# Patient Record
Sex: Male | Born: 1960 | Race: White | Hispanic: No | Smoking: Never smoker
Health system: Southern US, Community
[De-identification: ages and names within clinical notes are randomized; demographics above are authoritative.]

## PROBLEM LIST (undated history)

## (undated) DIAGNOSIS — D649 Anemia, unspecified: Secondary | ICD-10-CM

## (undated) DIAGNOSIS — M1990 Unspecified osteoarthritis, unspecified site: Secondary | ICD-10-CM

## (undated) DIAGNOSIS — F32A Depression, unspecified: Secondary | ICD-10-CM

## (undated) DIAGNOSIS — K2270 Barrett's esophagus without dysplasia: Secondary | ICD-10-CM

## (undated) DIAGNOSIS — F329 Major depressive disorder, single episode, unspecified: Secondary | ICD-10-CM

## (undated) DIAGNOSIS — G2581 Restless legs syndrome: Secondary | ICD-10-CM

## (undated) DIAGNOSIS — T7840XA Allergy, unspecified, initial encounter: Secondary | ICD-10-CM

## (undated) DIAGNOSIS — F909 Attention-deficit hyperactivity disorder, unspecified type: Secondary | ICD-10-CM

## (undated) DIAGNOSIS — K219 Gastro-esophageal reflux disease without esophagitis: Secondary | ICD-10-CM

## (undated) HISTORY — DX: Allergy, unspecified, initial encounter: T78.40XA

## (undated) HISTORY — DX: Unspecified osteoarthritis, unspecified site: M19.90

## (undated) HISTORY — DX: Restless legs syndrome: G25.81

## (undated) HISTORY — DX: Depression, unspecified: F32.A

## (undated) HISTORY — DX: Major depressive disorder, single episode, unspecified: F32.9

## (undated) HISTORY — PX: GYNECOMASTIA EXCISION: SHX5170

## (undated) HISTORY — PX: INTUSSUSCEPTION REPAIR: SHX1847

## (undated) HISTORY — DX: Anemia, unspecified: D64.9

## (undated) HISTORY — DX: Barrett's esophagus without dysplasia: K22.70

## (undated) HISTORY — DX: Gastro-esophageal reflux disease without esophagitis: K21.9

## (undated) HISTORY — DX: Attention-deficit hyperactivity disorder, unspecified type: F90.9

---

## 2001-12-27 ENCOUNTER — Encounter: Payer: Self-pay | Admitting: Emergency Medicine

## 2001-12-27 ENCOUNTER — Emergency Department (HOSPITAL_COMMUNITY): Admission: AC | Admit: 2001-12-27 | Discharge: 2001-12-28 | Payer: Self-pay

## 2001-12-28 ENCOUNTER — Encounter: Payer: Self-pay | Admitting: Emergency Medicine

## 2007-04-17 ENCOUNTER — Emergency Department (HOSPITAL_COMMUNITY): Admission: EM | Admit: 2007-04-17 | Discharge: 2007-04-17 | Payer: Self-pay | Admitting: Emergency Medicine

## 2008-01-05 ENCOUNTER — Emergency Department (HOSPITAL_COMMUNITY): Admission: EM | Admit: 2008-01-05 | Discharge: 2008-01-05 | Payer: Self-pay | Admitting: Emergency Medicine

## 2008-12-30 ENCOUNTER — Emergency Department (HOSPITAL_COMMUNITY): Admission: EM | Admit: 2008-12-30 | Discharge: 2008-12-30 | Payer: Self-pay | Admitting: Emergency Medicine

## 2010-03-23 IMAGING — CR DG CHEST 2V
2 series · 2 of 2 positions shown · non-contrast
Comparison: None available

CLINICAL DATA: Syncope.  Fall.

CHEST - 2 VIEW

[w chest pa]
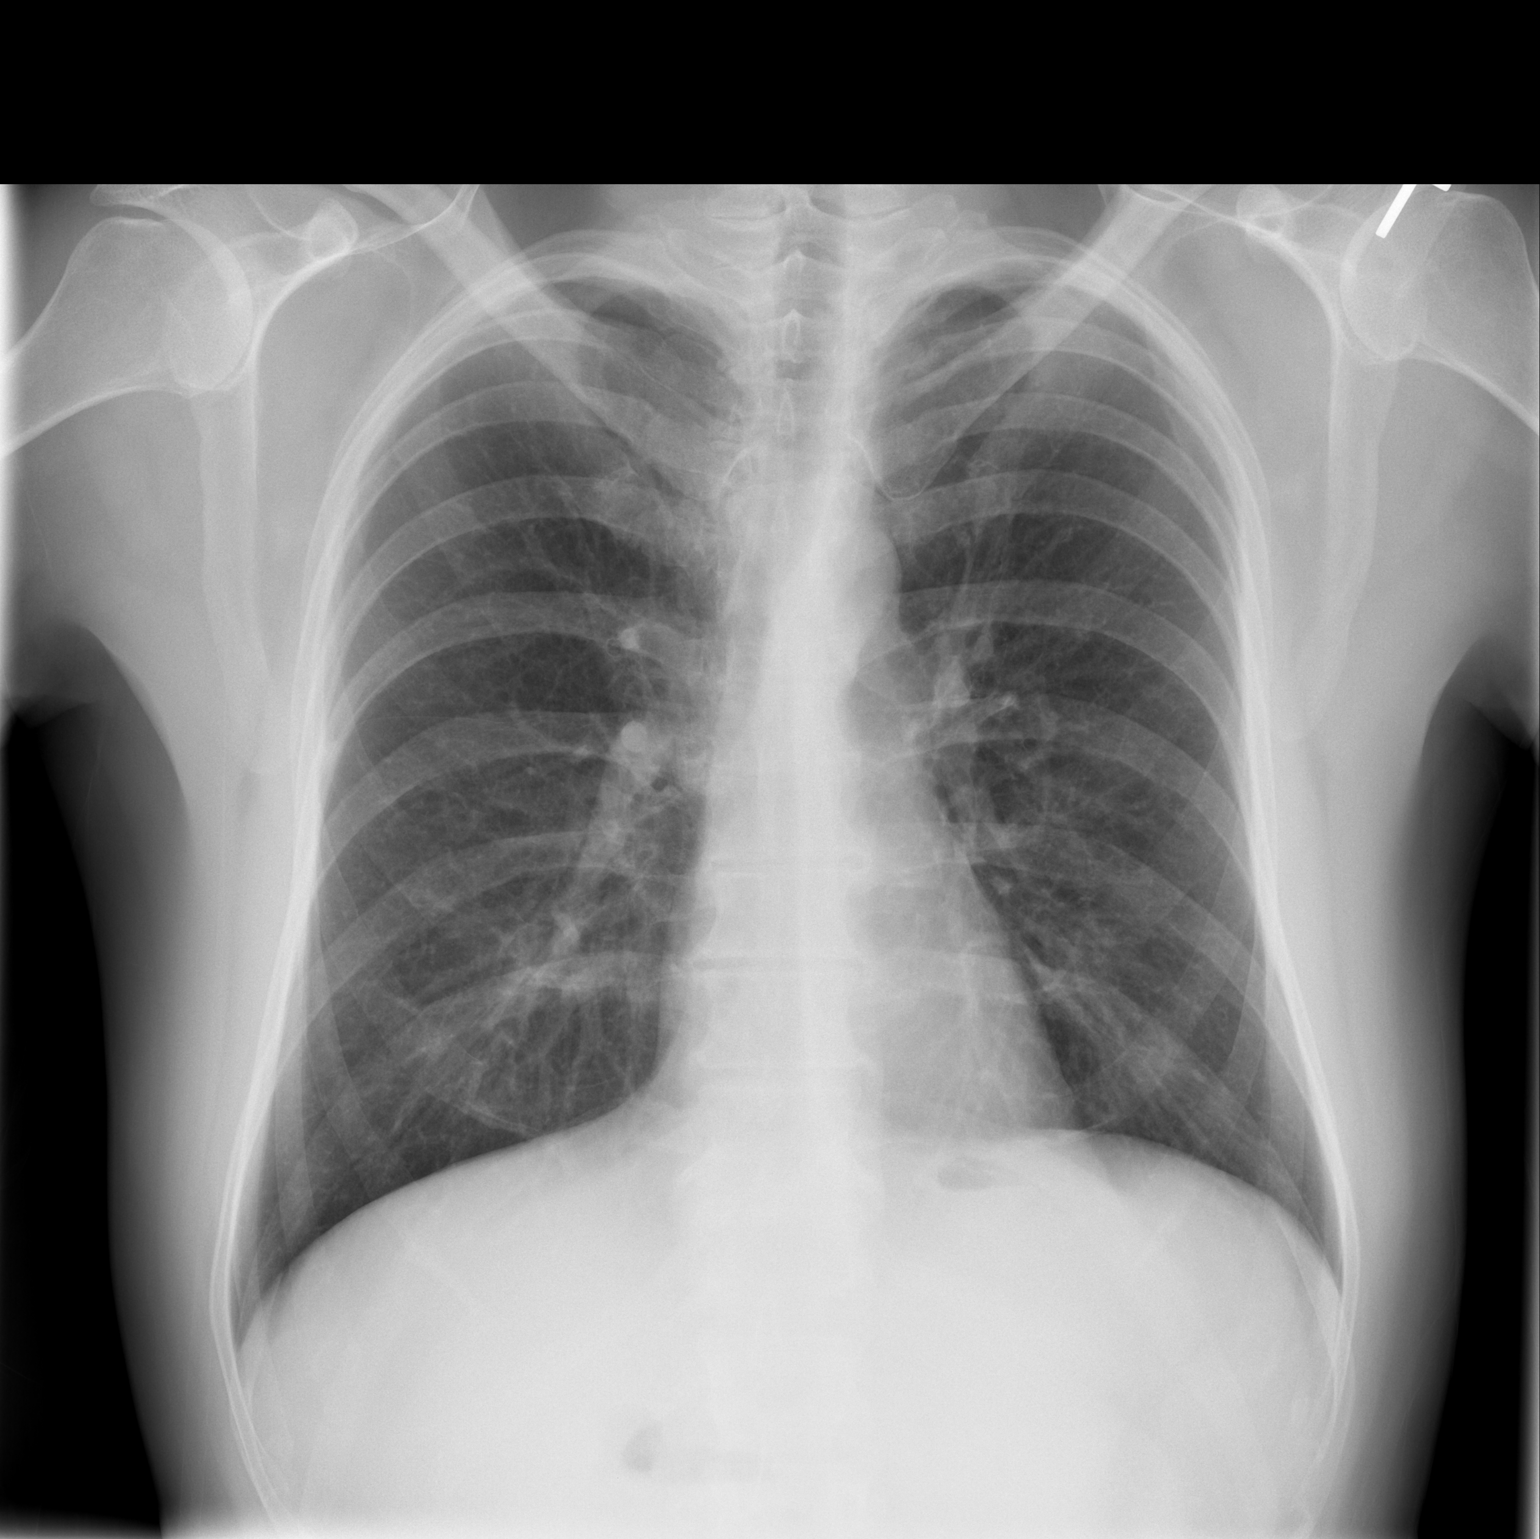

[w chest lat]
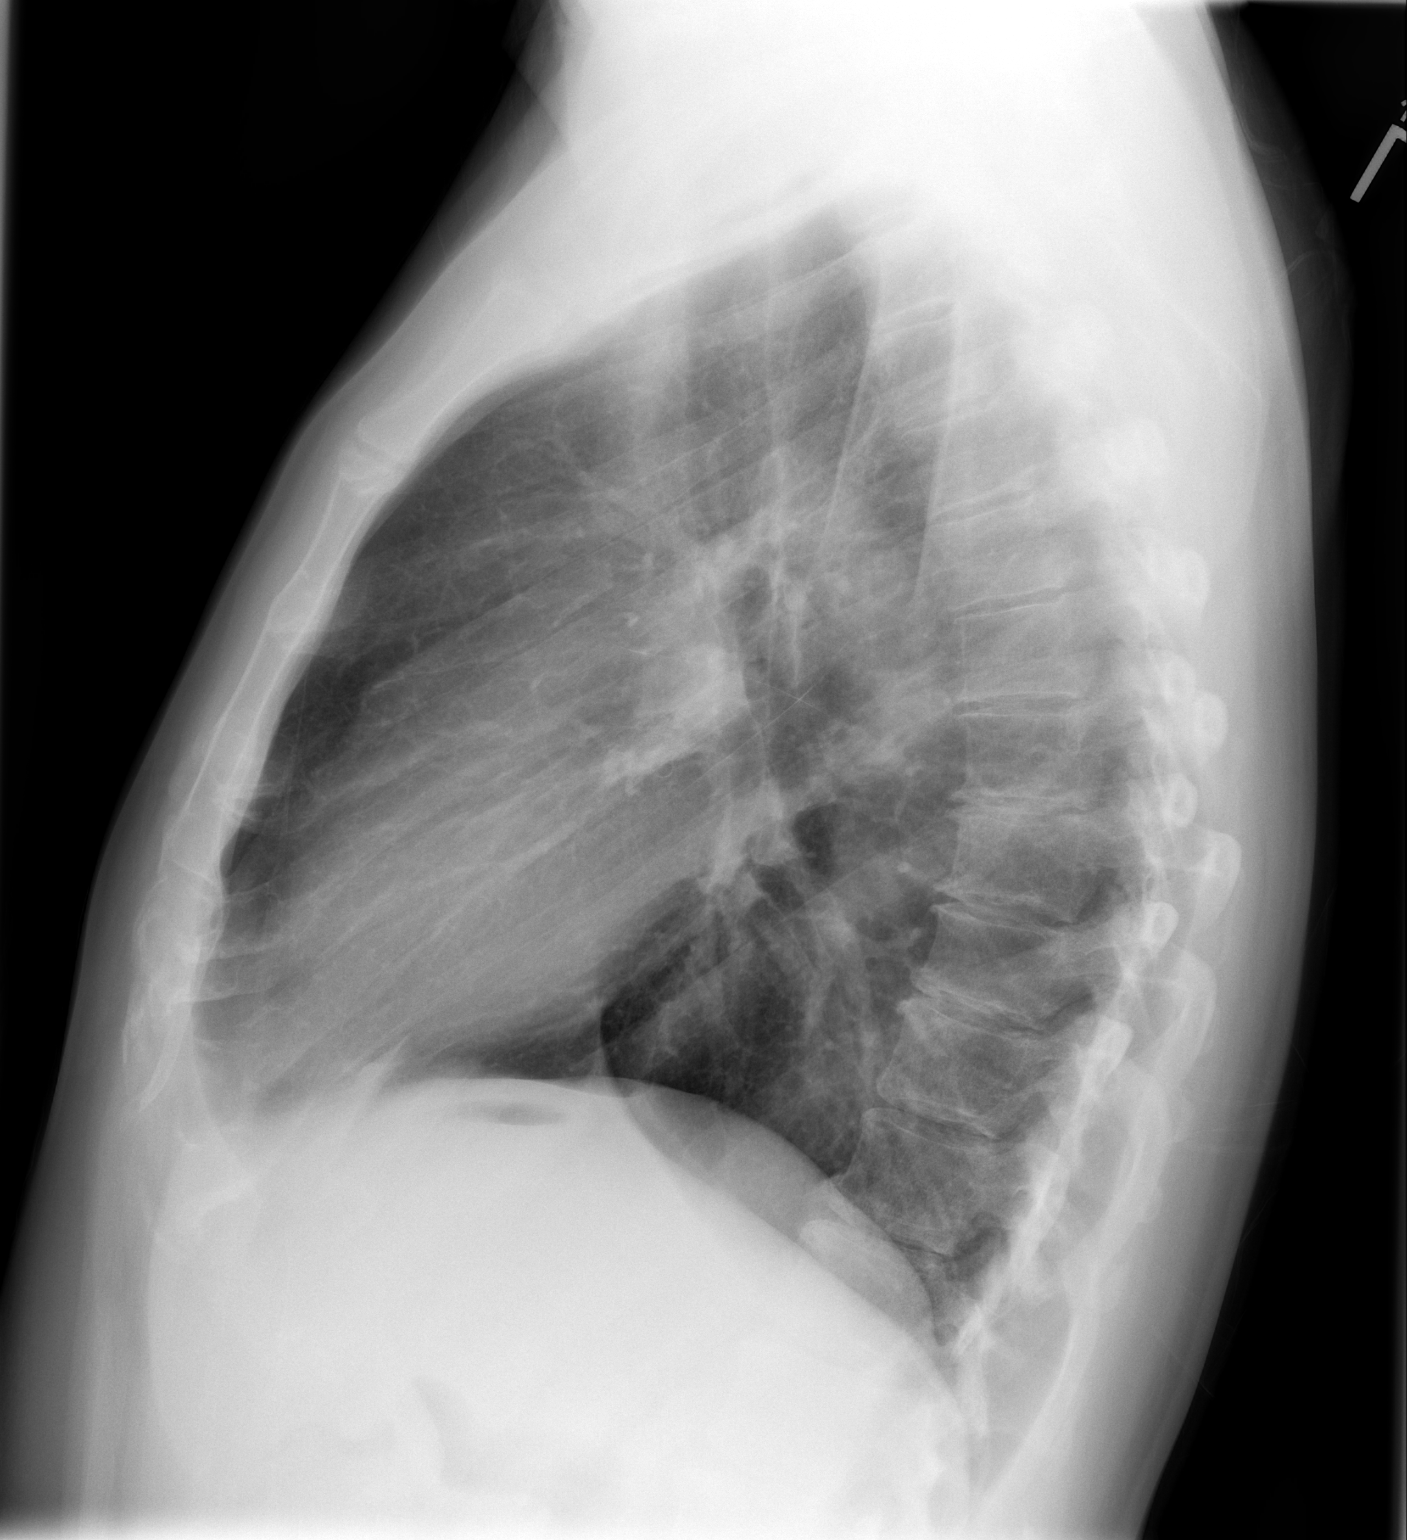

[2 of 2 positions shown; findings below may reference images not displayed]

FINDINGS: Emphysema.  Cardiopericardial silhouette and mediastinal
contours appear within normal limits.  No airspace disease
suspicious for aspiration pneumonitis.  Mild thoracic degenerative
disease.  Trachea appears within normal limits. Enlargement of the
retrosternal clear space is present on the lateral view.
Hyperinflation is present on the frontal view.
IMPRESSION: No acute cardiopulmonary disease.  Emphysema.

## 2010-10-18 ENCOUNTER — Emergency Department (HOSPITAL_COMMUNITY)
Admission: EM | Admit: 2010-10-18 | Discharge: 2010-10-18 | Disposition: A | Discharge status: Home / Self Care | Payer: 59 | Attending: Emergency Medicine | Admitting: Emergency Medicine

## 2010-10-18 ENCOUNTER — Emergency Department (HOSPITAL_COMMUNITY): Payer: 59

## 2010-10-18 DIAGNOSIS — R21 Rash and other nonspecific skin eruption: Secondary | ICD-10-CM | POA: Insufficient documentation

## 2010-10-18 DIAGNOSIS — K219 Gastro-esophageal reflux disease without esophagitis: Secondary | ICD-10-CM | POA: Insufficient documentation

## 2010-10-18 DIAGNOSIS — L509 Urticaria, unspecified: Secondary | ICD-10-CM | POA: Insufficient documentation

## 2010-10-18 DIAGNOSIS — Z79899 Other long term (current) drug therapy: Secondary | ICD-10-CM | POA: Insufficient documentation

## 2010-10-18 DIAGNOSIS — E039 Hypothyroidism, unspecified: Secondary | ICD-10-CM | POA: Insufficient documentation

## 2010-10-18 LAB — DIFFERENTIAL
Basophils Absolute: 0.1 10*3/uL (ref 0.0–0.1)
Basophils Relative: 1 % (ref 0–1)
Eosinophils Absolute: 0.5 10*3/uL (ref 0.0–0.7)
Eosinophils Relative: 9 % — ABNORMAL HIGH (ref 0–5)
Lymphocytes Relative: 36 % (ref 12–46)
Lymphs Abs: 1.9 10*3/uL (ref 0.7–4.0)
Monocytes Absolute: 0.5 10*3/uL (ref 0.1–1.0)
Monocytes Relative: 10 % (ref 3–12)
Neutro Abs: 2.3 10*3/uL (ref 1.7–7.7)
Neutrophils Relative %: 44 % (ref 43–77)

## 2010-10-18 LAB — CBC
HCT: 46.3 % (ref 39.0–52.0)
Hemoglobin: 16.4 g/dL (ref 13.0–17.0)
MCHC: 35.4 g/dL (ref 30.0–36.0)
RBC: 5.36 MIL/uL (ref 4.22–5.81)

## 2010-10-18 LAB — HEPATIC FUNCTION PANEL
ALT: 33 U/L (ref 0–53)
AST: 34 U/L (ref 0–37)
Alkaline Phosphatase: 82 U/L (ref 39–117)
Bilirubin, Direct: 0.2 mg/dL (ref 0.0–0.3)
Total Bilirubin: 0.6 mg/dL (ref 0.3–1.2)

## 2010-10-18 LAB — BASIC METABOLIC PANEL
CO2: 25 mEq/L (ref 19–32)
Chloride: 101 mEq/L (ref 96–112)
Glucose, Bld: 88 mg/dL (ref 70–99)
Potassium: 3.9 mEq/L (ref 3.5–5.1)
Sodium: 137 mEq/L (ref 135–145)

## 2011-06-26 LAB — COMPREHENSIVE METABOLIC PANEL
ALT: 24
AST: 25
Calcium: 9.3
Creatinine, Ser: 1.16
GFR calc Af Amer: >60
Sodium: 137
Total Protein: 7.2

## 2011-06-26 LAB — DIFFERENTIAL
Eosinophils Absolute: 0.1
Eosinophils Relative: 1
Lymphocytes Relative: 30
Lymphs Abs: 2.6
Monocytes Relative: 10
Neutrophils Relative %: 59

## 2011-06-26 LAB — URINALYSIS, ROUTINE W REFLEX MICROSCOPIC
Glucose, UA: NEGATIVE
Hgb urine dipstick: NEGATIVE
Protein, ur: NEGATIVE
pH: 6

## 2011-06-26 LAB — CBC
MCHC: 34.7
MCV: 89.8
RDW: 12.3

## 2012-02-08 ENCOUNTER — Other Ambulatory Visit: Payer: Self-pay | Admitting: Family Medicine

## 2012-02-21 ENCOUNTER — Encounter: Payer: Self-pay | Admitting: Family Medicine

## 2012-02-21 ENCOUNTER — Ambulatory Visit (INDEPENDENT_AMBULATORY_CARE_PROVIDER_SITE_OTHER): Payer: BC Managed Care – PPO | Admitting: Family Medicine

## 2012-02-21 VITALS — BP 121/79 | HR 93 | Temp 98.4°F | Resp 16 | Ht 70.25 in | Wt 201.0 lb

## 2012-02-21 DIAGNOSIS — E663 Overweight: Secondary | ICD-10-CM

## 2012-02-21 DIAGNOSIS — R0609 Other forms of dyspnea: Secondary | ICD-10-CM

## 2012-02-21 DIAGNOSIS — M2550 Pain in unspecified joint: Secondary | ICD-10-CM

## 2012-02-21 DIAGNOSIS — G479 Sleep disorder, unspecified: Secondary | ICD-10-CM

## 2012-02-21 DIAGNOSIS — E039 Hypothyroidism, unspecified: Secondary | ICD-10-CM

## 2012-02-21 DIAGNOSIS — R5383 Other fatigue: Secondary | ICD-10-CM

## 2012-02-21 DIAGNOSIS — J309 Allergic rhinitis, unspecified: Secondary | ICD-10-CM

## 2012-02-21 DIAGNOSIS — Z0000 Encounter for general adult medical examination without abnormal findings: Secondary | ICD-10-CM

## 2012-02-21 LAB — CBC WITH DIFFERENTIAL/PLATELET
Hemoglobin: 16.1 g/dL (ref 13.0–17.0)
Lymphocytes Relative: 35 % (ref 12–46)
Lymphs Abs: 2.3 10*3/uL (ref 0.7–4.0)
MCH: 30.3 pg (ref 26.0–34.0)
Monocytes Relative: 8 % (ref 3–12)
Neutro Abs: 3.5 10*3/uL (ref 1.7–7.7)
Neutrophils Relative %: 53 % (ref 43–77)
Platelets: 357 10*3/uL (ref 150–400)
RBC: 5.31 MIL/uL (ref 4.22–5.81)
WBC: 6.5 10*3/uL (ref 4.0–10.5)

## 2012-02-21 LAB — POCT URINALYSIS DIPSTICK
Bilirubin, UA: NEGATIVE
Blood, UA: NEGATIVE
Glucose, UA: NEGATIVE
Leukocytes, UA: NEGATIVE
Nitrite, UA: NEGATIVE
Urobilinogen, UA: 0.2

## 2012-02-21 LAB — BASIC METABOLIC PANEL
CO2: 26 mEq/L (ref 19–32)
Calcium: 9.5 mg/dL (ref 8.4–10.5)
Chloride: 102 mEq/L (ref 96–112)
Glucose, Bld: 91 mg/dL (ref 70–99)
Potassium: 4.4 mEq/L (ref 3.5–5.3)
Sodium: 139 mEq/L (ref 135–145)

## 2012-02-21 MED ORDER — TADALAFIL 10 MG PO TABS: ORAL_TABLET | ORAL | Status: AC

## 2012-02-21 MED ORDER — LEVOTHYROXINE SODIUM 25 MCG PO TABS: 25.0000 ug | ORAL_TABLET | Freq: Every day | ORAL | Status: DC

## 2012-02-21 MED ORDER — MOMETASONE FUROATE 50 MCG/ACT NA SUSP: 2.0000 | Freq: Every day | NASAL | Status: DC

## 2012-02-21 MED ORDER — CALCIPOTRIENE 0.005 % EX SOLN: CUTANEOUS | Status: DC

## 2012-02-21 NOTE — Patient Instructions (Signed)
Shortness of Breath Shortness of breath (dyspnea) is the feeling of uneasy breathing. Shortness of breath does not always mean that there is a life-threatening illness. However, shortness of breath requires immediate medical care. CAUSES  Causes for shortness of breath include:  Not enough oxygen in the air (as with high altitudes or with a smoke-filled room).   Short-term (acute) lung disease, including:   Infections such as pneumonia.   Fluid in the lungs, such as heart failure.   A blood clot in the lungs (pulmonary embolism).   Lasting (chronic) lung diseases.   Heart disease (heart attack, angina, heart failure, and others).   Low red blood cells (anemia).   Poor physical fitness. This can cause shortness of breath when you exercise.   Chest or back injuries or stiffness.   Being overweight (obese).   Anxiety. This can make you feel like you are not getting enough air.  DIAGNOSIS  Serious medical problems can usually be found during your physical exam. Many tests may also be done to determine why you are having shortness of breath. Tests include:  Chest X-rays.   Lung function tests.   Blood tests.   Electrocardiography.   Exercise testing.   A cardiac echo.   Imaging scans.  Your caregiver may not be able to find a cause for your shortness of breath after your exam. In this case, it is important to have a follow-up exam with your caregiver as directed.  HOME CARE INSTRUCTIONS   Do not smoke. Smoking is a common cause of shortness of breath. Ask for help to stop smoking.   Avoid being around chemicals that may bother your breathing (paint fumes, dust).   Rest as needed. Slowly resume your usual activities.   If medicines were prescribed, take them as directed for the full length of time directed. This includes oxygen and any inhaled medicines.   Follow up with your caregiver as directed. Waiting to do so or failure to follow up could result in worsening of  your condition and possible disability or death.   Be sure you understand what to do or who to call if your shortness of breath worsens.  SEEK MEDICAL CARE IF:   Your condition does not improve in the time expected.   You have a hard time doing your normal activities even with rest.   You have any side effects or problems with the medicines prescribed.   You develop any new symptoms.  SEEK IMMEDIATE MEDICAL CARE IF:   Your shortness of breath is getting worse.   You feel lightheaded, faint, or develop a cough not controlled with medicines.   You start coughing up blood.   You have pain with breathing.   You have chest pain or pain in your arms, shoulders, or abdomen.   You have a fever.   You are unable to walk up stairs or exercise the way you normally do.   Your symptoms are getting worse.  Document Released: 05/23/2001 Document Revised: 08/17/2011 Document Reviewed: 01/08/2008 [facility] Patient Information 2012 [place], [place]    .Keeping you healthy  Get these tests  Blood pressure- Have your blood pressure checked once a year by your healthcare provider.  Normal blood pressure is 120/80  Weight- Have your body mass index (BMI) calculated to screen for obesity.  BMI is a measure of body fat based on height and weight. You can also calculate your own BMI at [email or web address].  Cholesterol- Have your cholesterol checked every year.  Diabetes- Have your blood sugar checked regularly if you have high blood pressure, high cholesterol, have a family history of diabetes or if you are overweight.  Screening for Colon Cancer- Colonoscopy starting at age 31.  Screening may begin sooner depending on your family history and other health conditions. Follow up colonoscopy as directed by your Gastroenterologist.  Screening for Prostate Cancer- Both blood work (PSA) and a rectal exam help screen for Prostate Cancer.  Screening begins at age 58 with African-American men and  at age 62 with Caucasian men.  Screening may begin sooner depending on your family history.  Take these medicines  Aspirin- One aspirin daily can help prevent Heart disease and Stroke.  Flu shot- Every fall.  Tetanus- Every 10 years.  Zostavax- Once after the age of 23 to prevent Shingles.  Pneumonia shot- Once after the age of 24; if you are younger than 77, ask your healthcare provider if you need a Pneumonia shot.  Take these steps  Don't smoke- If you do smoke, talk to your doctor about quitting.  For tips on how to quit, go to www.smokefree.gov or call 1-800-QUIT-NOW.  Be physically active- Exercise 5 days a week for at least 30 minutes.  If you are not already physically active start slow and gradually work up to 30 minutes of moderate physical activity.  Examples of moderate activity include walking briskly, mowing the yard, dancing, swimming, bicycling, etc.  Eat a healthy diet- Eat a variety of healthy food such as fruits, vegetables, low fat milk, low fat cheese, yogurt, lean meant, poultry, fish, beans, tofu, etc. For more information go to www.thenutritionsource.org  Drink alcohol in moderation- Limit alcohol intake to less than two drinks a day. Never drink and drive.  Dentist- Brush and floss twice daily; visit your dentist twice a year.  Depression- Your emotional health is as important as your physical health. If you're feeling down, or losing interest in things you would normally enjoy please talk to your healthcare provider.  Eye exam- Visit your eye doctor every year.  Safe sex- If you may be exposed to a sexually transmitted infection, use a condom.  Seat belts- Seat belts can save your life; always wear one.  Smoke/Carbon Monoxide detectors- These detectors need to be installed on the appropriate level of your home.  Replace batteries at least once a year.  Skin cancer- When out in the sun, cover up and use sunscreen 15 SPF or higher.  Violence- If anyone is  threatening you, please tell your healthcare provider.  Living Will/ Health care power of attorney- Speak with your healthcare provider and family.

## 2012-02-22 LAB — GC/CHLAMYDIA PROBE AMP, URINE
Chlamydia, Swab/Urine, PCR: NEGATIVE
GC Probe Amp, Urine: NEGATIVE

## 2012-02-22 LAB — TESTOSTERONE: Testosterone: 352.52 ng/dL (ref 300–890)

## 2012-02-26 ENCOUNTER — Encounter: Payer: Self-pay | Admitting: Family Medicine

## 2012-02-26 DIAGNOSIS — E663 Overweight: Secondary | ICD-10-CM | POA: Insufficient documentation

## 2012-02-26 DIAGNOSIS — J309 Allergic rhinitis, unspecified: Secondary | ICD-10-CM | POA: Insufficient documentation

## 2012-02-26 DIAGNOSIS — F329 Major depressive disorder, single episode, unspecified: Secondary | ICD-10-CM | POA: Insufficient documentation

## 2012-02-26 DIAGNOSIS — K219 Gastro-esophageal reflux disease without esophagitis: Secondary | ICD-10-CM | POA: Insufficient documentation

## 2012-02-26 DIAGNOSIS — E039 Hypothyroidism, unspecified: Secondary | ICD-10-CM | POA: Insufficient documentation

## 2012-02-26 NOTE — Progress Notes (Signed)
Quick Note:  Please call pt and advise that the following labs are abnormal... All labs are normal which is great but Vit D level is low normal; get OTC Vitamin D 2000 IU and take 1 capsule daily. (Goal Vit D level is ~ 50) This will help with his overall health given the multiple issues addressed at CPE visit.  Copy to pt. ______

## 2012-02-26 NOTE — Progress Notes (Signed)
Subjective:    Patient ID: Bradley Nolan, male    DOB: 1961/02/23, 51 y.o.   MRN: [id number]  HPI  This 51 y.o.Cauc male  Is here for CPE; he has ADHD, RLS, Barrett's Esophagitis (sees GI  specialist every 3-5 years for EGD). He has Allergies, Hx of Anemia, Chronic Depression (Dr. Geraldine Contras, Psychiatrist) and Hypothyroidism.         He works as a Radiation protection practitioner; he is divorced and drinks wine occasionally. Exercise- 1x/week.  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   Last Colonoscopy: 2009 (Dr. Mckinley Jewel in [place], [place].) -normal    Review of Systems  Constitutional: Positive for diaphoresis, activity change and unexpected weight change. Negative for fever, chills, appetite change and fatigue.  HENT: Positive for congestion, rhinorrhea, voice change and postnasal drip.   Eyes: Positive for visual disturbance.       Cloudy, hazy vision x 6+ months  Respiratory: Positive for shortness of breath.        Exertional  Cardiovascular: Negative.   Gastrointestinal: Negative.   Genitourinary: Negative.   Musculoskeletal: Positive for arthralgias. Negative for back pain, joint swelling and gait problem.       Joint pain in knees: 5/10 with regular activity; has been running for 3 months  Skin:       Has a scaly lump on posterior scalp  Neurological: Negative.   Hematological: Negative.   Psychiatric/Behavioral: Positive for disturbed wake/sleep cycle and decreased concentration.       Never sleeps > 2-3 hours; Thinks he may have sleep apnea       Objective:   Physical Exam  Nursing note and vitals reviewed. Constitutional: He is oriented to person, place, and time. He appears well-developed and well-nourished. No distress.  HENT:  Head: Normocephalic and atraumatic.  Right Ear: External ear normal.  Left Ear:  External ear normal.  Nose: Nose normal.  Mouth/Throat: No oropharyngeal exudate.       Posterior pharynx with cobblestoning and erythema  Eyes: Conjunctivae and EOM are normal. Pupils are equal, round, and reactive to light. No scleral icterus.  Neck: Normal range of motion. Neck supple. No thyromegaly present.  Cardiovascular: Normal rate, regular rhythm, normal heart sounds and intact distal pulses.  Exam reveals no gallop and no friction rub.   No murmur heard. Pulmonary/Chest: Effort normal and breath sounds normal. No respiratory distress. He has no wheezes.  Abdominal: He exhibits no mass. There is no tenderness. There is no guarding. Hernia confirmed negative in the right inguinal area and confirmed negative in the left inguinal area.       No organomegaly  Genitourinary: Testes normal and penis normal. Right testis shows no mass, no swelling and no tenderness. Right testis is descended. Left testis shows no mass, no swelling and no tenderness. Left testis is descended. No penile erythema. No discharge found.  Musculoskeletal: Normal range of motion. He exhibits no edema and no tenderness.  Lymphadenopathy:    He has no cervical adenopathy.       Right: No inguinal adenopathy present.       Left: No inguinal adenopathy present.  Neurological: He is alert and oriented to person, place, and time. He has normal reflexes. No cranial nerve deficit. He exhibits normal muscle tone. Coordination normal.  Skin: Skin is warm and dry. No  rash noted. No erythema.       Scalp- L posterior 1.5 cm slightly raised nontender nodule with scaliness  Psychiatric: He has a normal mood and affect. His behavior is normal. Judgment and thought content normal.       Mildly anxious    ECG: NSR; no ectopy or ST-TW changes      Assessment & Plan:   1. Routine general medical examination at a health care facility  EKG 12-Lead, GC/chlamydia probe amp, urine, POCT urinalysis dipstick, HIV antibody  2.  Dyspnea on exertion  CBC with Differential, EKG 12-Lead  3. Sleep disturbance  Ambulatory referral to Sleep Studies  4. Allergic rhinitis, cause unspecified  Continue OTC antihistamine and Steroid nasal spray  5. Fatigue  Testosterone, CBC with Differential, Basic metabolic panel, Ambulatory referral to Sleep Studies  6. Joint pain  Vitamin D, 25-hydroxy  7. Hypothyroidism - on very low dose of Levothyroxine currently TSH, T4, Free

## 2012-04-08 ENCOUNTER — Encounter (HOSPITAL_BASED_OUTPATIENT_CLINIC_OR_DEPARTMENT_OTHER): Payer: 59

## 2012-04-23 ENCOUNTER — Ambulatory Visit (HOSPITAL_BASED_OUTPATIENT_CLINIC_OR_DEPARTMENT_OTHER): Payer: BC Managed Care – PPO | Attending: Family Medicine

## 2012-04-23 DIAGNOSIS — G4733 Obstructive sleep apnea (adult) (pediatric): Secondary | ICD-10-CM

## 2012-04-28 DIAGNOSIS — G4733 Obstructive sleep apnea (adult) (pediatric): Secondary | ICD-10-CM

## 2012-04-29 NOTE — Procedures (Signed)
Bradley Nolan, Bradley Nolan NO.:  [id number]  MEDICAL RECORD NO.:  [id number]          PATIENT TYPE:  OUT  LOCATION:  SLEEP CENTER                 FACILITY:  [facility]  PHYSICIAN:  Clinton D. Maple Hudson, MD, FCCP, FACPDATE OF BIRTH:  05-Nov[birth year]1960  DATE OF STUDY:  04/23/2012                           NOCTURNAL POLYSOMNOGRAM  REFERRING PHYSICIAN:  Maurice March, M.D.  INDICATION FOR STUDY:  Insomnia with sleep apnea.  EPWORTH SLEEPINESS SCORE:  6/24.  BMI 26.9, weight 193 pounds, height 71 inches.  Neck 16 inches.  MEDICATIONS:  Home medications charted and reviewed.  Bedtime medication:  None.  SLEEP ARCHITECTURE:  Total sleep time 361.5 minutes with sleep efficiency 89.7%.  Stage I was 3.2%, stage II 81.1%, stage III 0.1%, REM 15.6% of total sleep time.  Sleep latency 5.5 minutes, REM latency 114 minutes.  Awake after sleep onset 36 minutes.  Arousal index 19.1.  RESPIRATORY DATA:  Apnea-hypopnea index (AHI) 18.6 per hour.  A total of 112 events was scored including 49 obstructive apneas, 29 central apneas, 1 mixed apnea, 33 hypopneas.  Events were seen in all sleep positions, especially while supine.  REM AHI 61.6 per hour.  There were insufficient numbers of early events to meet protocol requirements for application of split study protocol, CPAP titration on this study night.  OXYGEN DATA:  Moderate snoring with oxygen desaturation to a nadir of 89% and mean oxygen saturation through the study of 95.4% on room air.  CARDIAC DATA:  Sinus rhythm with rare PVC.  MOVEMENT-PARASOMNIA:  No significant movement disturbance.  Bathroom x1.  IMPRESSIONS-RECOMMENDATIONS: 1. Moderate obstructive sleep apnea/hypopnea syndrome, AHI 18.6 per     hour.  There were obstructive and central events.  Moderate snoring     with oxygen desaturation to a nadir of 89% and mean oxygen     saturation through the study of 95.4% on room air. 2. There were insufficient numbers of early  events to permit     application of split protocol, CPAP titration on this     study night.  Consider return for dedicated CPAP titration study or     evaluate for alternative management as clinically appropriate.     Clinton D. Maple Hudson, MD, [facility], FACP Diplomate, American Board of Sleep Medicine    CDY/MEDQ  D:  04/28/2012 14:35:11  T:  04/29/2012 03:39:25  Job:  [id number]

## 2012-04-30 NOTE — Progress Notes (Signed)
I last saw this pt in June and referred him for Sleep Study. Results indicate that he needs to return for study using CPAP device (this was not done at recent study). Contact pt to find out if he wants follow-up study scheduled.

## 2012-05-13 ENCOUNTER — Other Ambulatory Visit: Payer: Self-pay | Admitting: Physician Assistant

## 2012-07-23 ENCOUNTER — Other Ambulatory Visit: Payer: Self-pay | Admitting: Physician Assistant

## 2012-08-18 ENCOUNTER — Other Ambulatory Visit: Payer: Self-pay | Admitting: Family Medicine

## 2012-08-31 ENCOUNTER — Other Ambulatory Visit: Payer: Self-pay | Admitting: Physician Assistant

## 2012-09-10 ENCOUNTER — Emergency Department (HOSPITAL_COMMUNITY): Payer: BC Managed Care – PPO

## 2012-09-10 ENCOUNTER — Observation Stay (HOSPITAL_COMMUNITY)
Admission: EM | Admit: 2012-09-10 | Discharge: 2012-09-11 | Disposition: A | Discharge status: Home / Self Care | Payer: BC Managed Care – PPO | Attending: Internal Medicine | Admitting: Internal Medicine

## 2012-09-10 ENCOUNTER — Encounter (HOSPITAL_COMMUNITY): Payer: Self-pay | Admitting: *Deleted

## 2012-09-10 DIAGNOSIS — Z79899 Other long term (current) drug therapy: Secondary | ICD-10-CM | POA: Insufficient documentation

## 2012-09-10 DIAGNOSIS — G2581 Restless legs syndrome: Secondary | ICD-10-CM | POA: Insufficient documentation

## 2012-09-10 DIAGNOSIS — Z7982 Long term (current) use of aspirin: Secondary | ICD-10-CM | POA: Insufficient documentation

## 2012-09-10 DIAGNOSIS — R079 Chest pain, unspecified: Principal | ICD-10-CM | POA: Insufficient documentation

## 2012-09-10 DIAGNOSIS — R05 Cough: Secondary | ICD-10-CM | POA: Diagnosis present

## 2012-09-10 DIAGNOSIS — E663 Overweight: Secondary | ICD-10-CM

## 2012-09-10 DIAGNOSIS — F3289 Other specified depressive episodes: Secondary | ICD-10-CM | POA: Insufficient documentation

## 2012-09-10 DIAGNOSIS — F909 Attention-deficit hyperactivity disorder, unspecified type: Secondary | ICD-10-CM | POA: Insufficient documentation

## 2012-09-10 DIAGNOSIS — M79609 Pain in unspecified limb: Secondary | ICD-10-CM | POA: Insufficient documentation

## 2012-09-10 DIAGNOSIS — F329 Major depressive disorder, single episode, unspecified: Secondary | ICD-10-CM

## 2012-09-10 DIAGNOSIS — E039 Hypothyroidism, unspecified: Secondary | ICD-10-CM | POA: Insufficient documentation

## 2012-09-10 DIAGNOSIS — K219 Gastro-esophageal reflux disease without esophagitis: Secondary | ICD-10-CM | POA: Insufficient documentation

## 2012-09-10 DIAGNOSIS — J309 Allergic rhinitis, unspecified: Secondary | ICD-10-CM | POA: Insufficient documentation

## 2012-09-10 DIAGNOSIS — R059 Cough, unspecified: Secondary | ICD-10-CM | POA: Insufficient documentation

## 2012-09-10 LAB — POCT I-STAT TROPONIN I

## 2012-09-10 LAB — TROPONIN I: Troponin I: <0.3 ng/mL (ref <0.30)

## 2012-09-10 LAB — CBC
HCT: 45 % (ref 39.0–52.0)
MCHC: 33.8 g/dL (ref 30.0–36.0)
MCV: 89.3 fL (ref 78.0–100.0)
RDW: 11.9 % (ref 11.5–15.5)

## 2012-09-10 LAB — CBC WITH DIFFERENTIAL/PLATELET
Basophils Relative: 1 % (ref 0–1)
Eosinophils Absolute: 0.3 10*3/uL (ref 0.0–0.7)
Lymphs Abs: 3.2 10*3/uL (ref 0.7–4.0)
MCH: 30.9 pg (ref 26.0–34.0)
MCHC: 35.2 g/dL (ref 30.0–36.0)
Neutrophils Relative %: 48 % (ref 43–77)
Platelets: 348 10*3/uL (ref 150–400)
RBC: 5.21 MIL/uL (ref 4.22–5.81)

## 2012-09-10 LAB — HEPATIC FUNCTION PANEL
ALT: 24 U/L (ref 0–53)
AST: 28 U/L (ref 0–37)
Alkaline Phosphatase: 82 U/L (ref 39–117)
Bilirubin, Direct: <0.1 mg/dL (ref 0.0–0.3)
Total Bilirubin: 0.2 mg/dL — ABNORMAL LOW (ref 0.3–1.2)

## 2012-09-10 LAB — BASIC METABOLIC PANEL
GFR calc Af Amer: >90 mL/min (ref >90)
GFR calc non Af Amer: >90 mL/min (ref >90)
Potassium: 4.4 mEq/L (ref 3.5–5.1)
Sodium: 139 mEq/L (ref 135–145)

## 2012-09-10 MED ORDER — ACETAMINOPHEN 650 MG RE SUPP: 650.0000 mg | Freq: Four times a day (QID) | RECTAL | Status: DC | PRN

## 2012-09-10 MED ORDER — ACETAMINOPHEN 325 MG PO TABS
650.0000 mg | ORAL_TABLET | Freq: Four times a day (QID) | ORAL | Status: DC | PRN
  Administered 2012-09-11: 650 mg via ORAL
  Filled 2012-09-10: qty 2

## 2012-09-10 MED ORDER — FLUTICASONE PROPIONATE 50 MCG/ACT NA SUSP
1.0000 | Freq: Two times a day (BID) | NASAL | Status: DC
  Administered 2012-09-10 – 2012-09-11 (×2): 1 via NASAL
  Filled 2012-09-10: qty 16

## 2012-09-10 MED ORDER — HYDROCODONE-HOMATROPINE 5-1.5 MG/5ML PO SYRP
5.0000 mL | ORAL_SOLUTION | Freq: Four times a day (QID) | ORAL | Status: DC | PRN
Start: 2012-09-10 — End: 2012-09-11

## 2012-09-10 MED ORDER — GABAPENTIN 300 MG PO CAPS: 300.0000 mg | ORAL_CAPSULE | Freq: Three times a day (TID) | ORAL | Status: DC

## 2012-09-10 MED ORDER — ENOXAPARIN SODIUM 40 MG/0.4ML ~~LOC~~ SOLN
40.0000 mg | Freq: Every day | SUBCUTANEOUS | Status: DC
  Administered 2012-09-10 – 2012-09-11 (×2): 40 mg via SUBCUTANEOUS
  Filled 2012-09-10 (×2): qty 0.4

## 2012-09-10 MED ORDER — LORATADINE 10 MG PO TABS
10.0000 mg | ORAL_TABLET | Freq: Every day | ORAL | Status: DC
  Administered 2012-09-11: 10 mg via ORAL
  Filled 2012-09-10 (×2): qty 1

## 2012-09-10 MED ORDER — GABAPENTIN 300 MG PO CAPS
600.0000 mg | ORAL_CAPSULE | Freq: Every day | ORAL | Status: DC
  Administered 2012-09-10: 600 mg via ORAL
  Filled 2012-09-10 (×2): qty 2

## 2012-09-10 MED ORDER — LEVOTHYROXINE SODIUM 25 MCG PO TABS: 25.0000 ug | ORAL_TABLET | Freq: Every day | ORAL | Status: DC

## 2012-09-10 MED ORDER — MORPHINE SULFATE 2 MG/ML IJ SOLN: 1.0000 mg | INTRAMUSCULAR | Status: DC | PRN

## 2012-09-10 MED ORDER — SODIUM CHLORIDE 0.9 % IV SOLN
INTRAVENOUS | Status: DC
  Administered 2012-09-10: 20 mL/h via INTRAVENOUS

## 2012-09-10 MED ORDER — ASPIRIN 81 MG PO CHEW
81.0000 mg | CHEWABLE_TABLET | Freq: Every day | ORAL | Status: DC
  Administered 2012-09-11: 81 mg via ORAL
  Filled 2012-09-10: qty 1

## 2012-09-10 MED ORDER — LEVOTHYROXINE SODIUM 50 MCG PO TABS
50.0000 ug | ORAL_TABLET | Freq: Every day | ORAL | Status: DC
  Administered 2012-09-11: 50 ug via ORAL
  Filled 2012-09-10 (×2): qty 1

## 2012-09-10 MED ORDER — OXYCODONE HCL 5 MG PO TABS: 5.0000 mg | ORAL_TABLET | ORAL | Status: DC | PRN

## 2012-09-10 MED ORDER — PANTOPRAZOLE SODIUM 40 MG PO TBEC: 40.0000 mg | DELAYED_RELEASE_TABLET | Freq: Two times a day (BID) | ORAL | Status: DC

## 2012-09-10 MED ORDER — ONDANSETRON HCL 4 MG/2ML IJ SOLN: 4.0000 mg | Freq: Four times a day (QID) | INTRAMUSCULAR | Status: DC | PRN

## 2012-09-10 MED ORDER — ROPINIROLE HCL 0.5 MG PO TABS
0.5000 mg | ORAL_TABLET | Freq: Every day | ORAL | Status: DC
  Administered 2012-09-10: 0.5 mg via ORAL
  Filled 2012-09-10 (×2): qty 1

## 2012-09-10 MED ORDER — ASPIRIN 81 MG PO CHEW
324.0000 mg | CHEWABLE_TABLET | Freq: Once | ORAL | Status: AC
  Administered 2012-09-10: 324 mg via ORAL
  Filled 2012-09-10: qty 4

## 2012-09-10 MED ORDER — ONDANSETRON HCL 4 MG PO TABS: 4.0000 mg | ORAL_TABLET | Freq: Four times a day (QID) | ORAL | Status: DC | PRN

## 2012-09-10 MED ORDER — SODIUM CHLORIDE 0.9 % IV SOLN
INTRAVENOUS | Status: DC
  Administered 2012-09-10: 10:00:00 via INTRAVENOUS

## 2012-09-10 MED ORDER — GABAPENTIN 300 MG PO CAPS
300.0000 mg | ORAL_CAPSULE | Freq: Two times a day (BID) | ORAL | Status: DC
  Administered 2012-09-10 – 2012-09-11 (×2): 300 mg via ORAL
  Filled 2012-09-10 (×4): qty 1

## 2012-09-10 MED ORDER — PANTOPRAZOLE SODIUM 40 MG PO TBEC
40.0000 mg | DELAYED_RELEASE_TABLET | Freq: Two times a day (BID) | ORAL | Status: DC
  Administered 2012-09-11: 40 mg via ORAL
  Filled 2012-09-10 (×5): qty 1

## 2012-09-10 MED ORDER — AMPHETAMINE-DEXTROAMPHET ER 10 MG PO CP24
30.0000 mg | ORAL_CAPSULE | Freq: Every day | ORAL | Status: DC
Start: 2012-09-10 — End: 2012-09-11
  Administered 2012-09-11: 30 mg via ORAL
  Filled 2012-09-10: qty 2
  Filled 2012-09-10: qty 1

## 2012-09-10 MED ORDER — BUPROPION HCL ER (XL) 300 MG PO TB24
300.0000 mg | ORAL_TABLET | Freq: Every day | ORAL | Status: DC
Start: 2012-09-10 — End: 2012-09-11
  Administered 2012-09-11: 300 mg via ORAL
  Filled 2012-09-10 (×2): qty 1

## 2012-09-10 NOTE — ED Notes (Addendum)
Pt is [facility], sent home due to (barky/croup) coughing, cough started today. Given albuterol treatment. Pt noticed left sided substernal pain radiate to left arm. Intermittent Pain started 0845 Pain does not increase or decrease with cough. Dull ache 2/10.

## 2012-09-10 NOTE — ED Notes (Signed)
Patient transported to X-ray 

## 2012-09-10 NOTE — H&P (Signed)
[facility] History and Physical  [name] RUE:[id number] DOB: 11/08/1960 DOA: 09/10/2012  Referring physician: Dr. Freida Busman PCP: No primary provider on file.   Chief Complaint: chest pain  HPI: Bradley Nolan is a 51 y.o. male with PMH of hypothyroidism, depression, GERD, allergic rhinitis and ADHD; came to the ED with complaints of CP. Patient reports experiencing left sided CP, radiated to his left arm and lasting 5-7 minutes. It happens in 2 occasions. He denies any acute diaphoresis, nausea, palpitations, highhandedness or any other associated symptoms. Patient reports having late meal and also some beers right before going to bed day PTA. He had a strong family hx for CAD/heart problems and decide to been evaluated for this discomfort.  Of note he has been experiencing non productive cough for the last 3-4 weeks, denies fever, hemoptysis, hematemesis, chills, or any other complaints.    Review of Systems: Negative except as mentioned on HPI.  Past Medical History  Diagnosis Date  . Allergy   . Anemia   . Arthritis   . Depression   . GERD (gastroesophageal reflux disease)   . Restless leg   . ADHD (attention deficit hyperactivity disorder)   . Barrett's esophagus    Past Surgical History  Procedure Date  . Gynecomastia excision   . Intussusception repair     infant   Social History:  reports that he has never smoked. He does not have any smokeless tobacco history on file. He reports that he drinks alcohol. He reports that he does not use illicit drugs. From home; no needs with ADL's   Allergies  Allergen Reactions  . Sulfa Antibiotics Anaphylaxis    Family History  Problem Relation Age of Onset  . Cancer Mother 72    pancreatic  . Arthritis Mother   . Heart disease Father     MI  AND ASCVD  . Diabetes Father     TYPE 2  . Cancer Maternal Grandmother   . Arthritis Maternal Grandmother   . Cancer Maternal Grandfather   . Depression Daughter   .  ADD / ADHD Daughter   . Heart disease Paternal Aunt     ASCVD  . Heart disease Paternal Grandmother     CVA ?  Marland Kitchen Hypertension Paternal Grandmother   . Heart disease Paternal Grandfather     CVA, ASCVD  . Hypertension Paternal Grandfather     Prior to Admission medications   Medication Sig Start Date End Date Taking? Authorizing Provider  amphetamine-dextroamphetamine (ADDERALL XR) 30 MG 24 hr capsule Take 30 mg by mouth every morning.   Yes Historical Provider, MD  aspirin 81 MG chewable tablet Chew 81 mg by mouth daily.   Yes Historical Provider, MD  buPROPion (WELLBUTRIN XL) 300 MG 24 hr tablet Take 300 mg by mouth daily.   Yes Historical Provider, MD  Calcipotriene 0.005 % solution Apply a small amount to affected skin only. Use twice daily. 02/21/12  Yes Maurice March, MD  gabapentin (NEURONTIN) 100 MG capsule Take 300-600 mg by mouth 3 (three) times daily. Pt takes 300 mg every morning, 300 mg in the evening, 600 mg at bedtime   Yes Historical Provider, MD  levothyroxine (SYNTHROID, LEVOTHROID) 25 MCG tablet TAKE 1 TABLET EVERY DAY 08/31/12  Yes Marzella Schlein McClung, PA-C  levothyroxine (SYNTHROID, LEVOTHROID) 25 MCG tablet Take 50 mcg by mouth daily. 02/21/12  Yes Maurice March, MD  loperamide (IMODIUM A-D) 2 MG tablet Take 4 mg by mouth daily.  Yes Historical Provider, MD  NASONEX 50 MCG/ACT nasal spray PLACE 2 SPRAYS INTO THE NOSE DAILY. 08/18/12  Yes Ryan M Dunn, PA-C  omeprazole (PRILOSEC) 40 MG capsule Take 40 mg by mouth daily.   Yes Historical Provider, MD  rOPINIRole (REQUIP) 1 MG tablet Take 0.5 mg by mouth at bedtime.    Yes Historical Provider, MD  tadalafil (CIALIS) 10 MG tablet Take 1 tablet 1-4 hours prior to sexual activity. 02/21/12   Maurice March, MD   Physical Exam: Filed Vitals:   09/10/12 0912 09/10/12 1212 09/10/12 1250  BP: 130/86 113/82 118/59  Pulse: 82 72 60  Temp: 97.9 F (36.6 C) 98.3 F (36.8 C) 98 F (36.7 C)  TempSrc: Oral Oral Oral    Resp: 16 19 14   Height:   5\' 11"  (1.803 m)  Weight:   91.944 kg (202 lb 11.2 oz)  SpO2: 99% 100% 98%     General:  NAD. Coughing spells during interview, nasal congestion. afebrile  Eyes: no icterus, no nystagmus, EOMI, PERRLA  ENT: MMM, no erythema or exudates  Neck: supple, no JVD, no bruits or thyromegaly  Cardiovascular: RRR, no rubs, no gallops and no murmurs  Respiratory: CTA  Abdomen: soft, NT, ND, positive BS  Skin: no rash or petechiae  Musculoskeletal: no joint swelling or erythema  Psychiatric: no SI, no hallucinations  Neurologic: CN intact, AAOX#, no focal motor or sensory deficit.  Labs on Admission:  Basic Metabolic Panel:  Lab 09/10/12 1610  NA 139  K 4.4  CL 103  CO2 27  GLUCOSE 104*  BUN 10  CREATININE 0.78  CALCIUM 9.7  MG --  PHOS --   CBC:  Lab 09/10/12 0950  WBC 7.9  NEUTROABS 3.8  HGB 16.1  HCT 45.7  MCV 87.7  PLT 348    Radiological Exams on Admission: Dg Chest 2 View  09/10/2012  *RADIOLOGY REPORT*  Clinical Data: Chest pain, cough, congestion.  CHEST - 2 VIEW  Comparison: 10/18/2010  Findings: Heart and mediastinal contours are within normal limits. No focal opacities or effusions.  No acute bony abnormality.  IMPRESSION: No active cardiopulmonary disease.   Original Report Authenticated By: Charlett Nose, M.D.     EKG: No acute ischemic changes appreciated on initial evaluation.  Assessment/Plan 1-Chest pain: Atypical with typical features. Most likely secondary to GERD. -Admit to telemetry -Cycle CE'z and EKG -PPI BID -PRN pain meds -Patient with significant family hx for heart disease. Also >5 y/o and male as risk factors. -Check lipid panel; A!C and TSH as part of stratification.  2-Allergic rhinitis, cause unspecified: start on flonase and claritin  3-Hypothyroidism: continue synthroid. Check TSH  4-Anxiety/Depression: continue bupropion  5-GERD (gastroesophageal reflux disease): PPI BID.  6-Cough: start  PRN hycodan; treat GERD and PND.  7-Restless leg syndrome: continue requip.  DVT: lovenox   Code Status: Full Family Communication: no family at bedside Disposition Plan: admit to telemetry on observation to r/o ACS. Home tomorrow most likely  Time spent: >30 minutes  Job Hol[place]claw [facility] Pager [phone]  If 7PM-7AM, please contact night-coverage [email or web address] Password [email or web address] 09/10/2012, 3:30 PM

## 2012-09-10 NOTE — ED Notes (Signed)
Pt transported to 1425 on cardiac monitor with chart accompanied by this nurse, condition stable at time of transfer.

## 2012-09-10 NOTE — ED Provider Notes (Signed)
History     CSN: [id number]  Arrival date & time 09/10/12  0907   First MD Initiated Contact with Patient 09/10/12 [phone]      Chief Complaint  Patient presents with  . Chest Pain  . Cough    (Consider location/radiation/quality/duration/timing/severity/associated sxs/prior treatment) Patient is a 52 y.o. male presenting with chest pain and cough. The history is provided by the patient.  Chest Pain Primary symptoms include cough.    Cough Associated symptoms include chest pain.   patient here complaining of chest pain that started today. He has had 2 episodes lasting 5 minutes of substernal chest pain radiating down his left arm. No associated diaphoresis or dyspnea. No recent leg pain or swelling. Does have a cough currently has been nonproductive. No orthopnea or dyspnea on exertion. No prior history of similar symptoms. Did have a stress test 5 years ago which according to him was negative. Does have a strong family history of premature cardiac death in his family  Past Medical History  Diagnosis Date  . Allergy   . Anemia   . Arthritis   . Depression   . GERD (gastroesophageal reflux disease)   . Restless leg   . ADHD (attention deficit hyperactivity disorder)   . Barrett's esophagus     Past Surgical History  Procedure Date  . Gynecomastia excision   . Intussusception repair     infant    Family History  Problem Relation Age of Onset  . Cancer Mother 18    pancreatic  . Arthritis Mother   . Heart disease Father     MI  AND ASCVD  . Diabetes Father     TYPE 2  . Cancer Maternal Grandmother   . Arthritis Maternal Grandmother   . Cancer Maternal Grandfather   . Depression Daughter   . ADD / ADHD Daughter   . Heart disease Paternal Aunt     ASCVD  . Heart disease Paternal Grandmother     CVA ?  Marland Kitchen Hypertension Paternal Grandmother   . Heart disease Paternal Grandfather     CVA, ASCVD  . Hypertension Paternal Grandfather     History  Substance Use  Topics  . Smoking status: Never Smoker   . Smokeless tobacco: Not on file  . Alcohol Use: Yes     Comment: drink wine and beer      Review of Systems  Respiratory: Positive for cough.   Cardiovascular: Positive for chest pain.  All other systems reviewed and are negative.    Allergies  Sulfa antibiotics  Home Medications   Current Outpatient Rx  Name  Route  Sig  Dispense  Refill  . AMPHETAMINE-DEXTROAMPHET ER 30 MG PO CP24   Oral   Take 30 mg by mouth every morning.         . ASPIRIN 81 MG PO CHEW   Oral   Chew 81 mg by mouth daily.         . BUPROPION HCL ER (XL) 300 MG PO TB24   Oral   Take 300 mg by mouth daily.         Marland Kitchen CALCIPOTRIENE 0.005 % EX SOLN      Apply a small amount to affected skin only. Use twice daily.   60 mL   1   . GABAPENTIN 100 MG PO CAPS   Oral   Take 100 mg by mouth 2 (two) times daily.         Marland Kitchen LEVOTHYROXINE SODIUM  25 MCG PO TABS   Oral   Take 1 tablet (25 mcg total) by mouth daily.   90 tablet   1   . LEVOTHYROXINE SODIUM 25 MCG PO TABS      TAKE 1 TABLET EVERY DAY   90 tablet   0   . LOPERAMIDE HCL 2 MG PO TABS   Oral   Take 4 mg by mouth daily.         Marland Kitchen NASONEX 50 MCG/ACT NA SUSP      PLACE 2 SPRAYS INTO THE NOSE DAILY.   17 g   1   . OMEPRAZOLE 40 MG PO CPDR   Oral   Take 40 mg by mouth daily.         Marland Kitchen ROPINIROLE HCL 1 MG PO TABS   Oral   Take 1 mg by mouth at bedtime.         Marland Kitchen TADALAFIL 10 MG PO TABS      Take 1 tablet 1-4 hours prior to sexual activity.   10 tablet   3     BP 130/86  Pulse 82  Temp 97.9 F (36.6 C) (Oral)  Resp 16  SpO2 99%  Physical Exam  Nursing note and vitals reviewed. Constitutional: He is oriented to person, place, and time. He appears well-developed and well-nourished.  Non-toxic appearance. No distress.  HENT:  Head: Normocephalic and atraumatic.  Eyes: Conjunctivae normal, EOM and lids are normal. Pupils are equal, round, and reactive to light.    Neck: Normal range of motion. Neck supple. No tracheal deviation present. No mass present.  Cardiovascular: Normal rate, regular rhythm and normal heart sounds.  Exam reveals no gallop.   No murmur heard. Pulmonary/Chest: Effort normal and breath sounds normal. No stridor. No respiratory distress. He has no decreased breath sounds. He has no wheezes. He has no rhonchi. He has no rales.  Abdominal: Soft. Normal appearance and bowel sounds are normal. He exhibits no distension. There is no tenderness. There is no rebound and no CVA tenderness.  Musculoskeletal: Normal range of motion. He exhibits no edema and no tenderness.  Neurological: He is alert and oriented to person, place, and time. He has normal strength. No cranial nerve deficit or sensory deficit. GCS eye subscore is 4. GCS verbal subscore is 5. GCS motor subscore is 6.  Skin: Skin is warm and dry. No abrasion and no rash noted.  Psychiatric: He has a normal mood and affect. His speech is normal and behavior is normal.    ED Course  Procedures (including critical care time)   Labs Reviewed  CBC WITH DIFFERENTIAL  BASIC METABOLIC PANEL   No results found.   No diagnosis found.    MDM      Rate: 78   Rhythm: normal sinus rhythm  QRS Axis: normal  Intervals: normal  ST/T Wave abnormalities: normal  Conduction Disutrbances:none  Narrative Interpretation:   Old EKG Reviewed: none available   patient given full dose aspirin treatment here. Will be admitted for evaluation of his chest pain      Toy Baker, MD 09/10/12 1116

## 2012-09-11 LAB — CBC
MCH: 30 pg (ref 26.0–34.0)
Platelets: 272 10*3/uL (ref 150–400)
RBC: 4.7 MIL/uL (ref 4.22–5.81)
WBC: 7 10*3/uL (ref 4.0–10.5)

## 2012-09-11 LAB — LIPID PANEL
HDL: 44 mg/dL (ref >39)
LDL Cholesterol: 104 mg/dL — ABNORMAL HIGH (ref 0–99)
Total CHOL/HDL Ratio: 3.9 RATIO

## 2012-09-11 LAB — BASIC METABOLIC PANEL
Calcium: 8.8 mg/dL (ref 8.4–10.5)
GFR calc non Af Amer: >90 mL/min (ref >90)
Glucose, Bld: 96 mg/dL (ref 70–99)
Sodium: 137 mEq/L (ref 135–145)

## 2012-09-11 LAB — TROPONIN I: Troponin I: <0.3 ng/mL (ref <0.30)

## 2012-09-11 MED ORDER — NITROGLYCERIN 0.4 MG SL SUBL: 0.4000 mg | SUBLINGUAL_TABLET | SUBLINGUAL | Status: AC | PRN

## 2012-09-11 MED ORDER — INFLUENZA VIRUS VACC SPLIT PF IM SUSP
0.5000 mL | Freq: Once | INTRAMUSCULAR | Status: AC
  Administered 2012-09-11: 0.5 mL via INTRAMUSCULAR
  Filled 2012-09-11: qty 0.5

## 2012-09-11 MED ORDER — METOCLOPRAMIDE HCL 5 MG PO TABS: 5.0000 mg | ORAL_TABLET | Freq: Every day | ORAL | Status: DC | PRN

## 2012-09-11 NOTE — Discharge Summary (Signed)
Physician Discharge Summary  CLAYBORNE DIVIS WJX:[id number] DOB: 11-Aug-1961 DOA: 09/10/2012  PCP: Dow Adolph, MD - [facility]  Admit date: 09/10/2012 Discharge date: 09/11/2012   Recommendations for Outpatient Follow-up:  1. Patient will have a cardiac stress test arranged at the [facility]  Discharge Diagnoses:  Chest pain - unclear etiology-probable gastroesophageal reflux disease versus angina  Allergic rhinitis, cause unspecified  Hypothyroidism  Depression  GERD (gastroesophageal reflux disease)  Cough  Restless leg syndrome   Discharge Condition: Good, chest pain-free  Diet recommendation: Heart healthy  Filed Weights   09/10/12 1250  Weight: 91.944 kg (202 lb 11.2 oz)    History of present illness:  Bradley Nolan is a 52 y.o. male with PMH of hypothyroidism, depression, GERD, allergic rhinitis and ADHD; came to the ED with complaints of CP. Patient reports experiencing left sided CP, radiated to his left arm and lasting 5-7 minutes. It happens in 2 occasions. He denies any acute diaphoresis, nausea, palpitations, highhandedness or any other associated symptoms. Patient reports having late meal and also some beers right before going to bed day PTA. He had a strong family hx for CAD/heart problems and decide to been evaluated for this discomfort.  Of note he has been experiencing non productive cough for the last 3-4 weeks, denies fever, hemoptysis, hematemesis, chills, or any other complaints.    Hospital Course:  1. Chest pain - the patient presented to the hospital with an episode of about 2 minutes of chest pain typical in nature for angina. The patient had a normal EKG, normal cardiac enzymes and he was observed for 24 hours without further recurrence of his chest pain. Patient is referred for an outpatient stress test with [facility] cardiology and he was instructed to take nitroglycerin under the tongue if his  chest pain returns. If the chest pain does not resolve after nitroglycerin he is to call 911 2. severe gastroesophageal reflux disease-patient was instructed to continue scrotal pump inhibitor, work on lifestyle changes and we did also prescribed Reglan to take after large meals if he must go to bed within 3 hours 3. ADHD - patient was continued on his medications without changes  Procedures:  None (i.e. Studies not automatically included, echos, thoracentesis, etc; not x-rays)  Consultations:  None  Discharge Exam: Filed Vitals:   09/10/12 2226 09/11/12 0452 09/11/12 0454 09/11/12 0652  BP: 105/52 89/46 99/69  116/70  Pulse: 62 62    Temp: 97.9 F (36.6 C) 97.7 F (36.5 C)    TempSrc: Oral Oral    Resp: 18 18    Height:      Weight:      SpO2: 100% 100%      General: Alert and oriented x3 Cardiovascular: Regular rate and rhythm without murmurs rubs or gallops Respiratory: Clear to auscultation bilaterally  Discharge Instructions  Discharge Orders    Future Orders Please Complete By Expires   Diet general      Increase activity slowly          Medication List     As of 09/11/2012 10:23 AM    TAKE these medications         amphetamine-dextroamphetamine 30 MG 24 hr capsule   Commonly known as: ADDERALL XR   Take 30 mg by mouth every morning.      aspirin 81 MG chewable tablet   Chew 81 mg by mouth daily.      buPROPion 300 MG 24 hr tablet  Commonly known as: WELLBUTRIN XL   Take 300 mg by mouth daily.      Calcipotriene 0.005 % solution   Apply a small amount to affected skin only. Use twice daily.      gabapentin 100 MG capsule   Commonly known as: NEURONTIN   Take 300-600 mg by mouth 3 (three) times daily. Pt takes 300 mg every morning, 300 mg in the evening, 600 mg at bedtime      levothyroxine 25 MCG tablet   Commonly known as: SYNTHROID, LEVOTHROID   Take 50 mcg by mouth daily.      loperamide 2 MG tablet   Commonly known as: IMODIUM A-D   Take 4  mg by mouth daily.      metoCLOPramide 5 MG tablet   Commonly known as: REGLAN   Take 1 tablet (5 mg total) by mouth daily as needed (to take after a large meal if you need to go to bed less than 3 hours after the meal ).      NASONEX 50 MCG/ACT nasal spray   Generic drug: mometasone   PLACE 2 SPRAYS INTO THE NOSE DAILY.      nitroGLYCERIN 0.4 MG SL tablet   Commonly known as: NITROSTAT   Place 1 tablet (0.4 mg total) under the tongue every 5 (five) minutes as needed for chest pain.      omeprazole 40 MG capsule   Commonly known as: PRILOSEC   Take 40 mg by mouth daily.      rOPINIRole 1 MG tablet   Commonly known as: REQUIP   Take 0.5 mg by mouth at bedtime.      tadalafil 10 MG tablet   Commonly known as: CIALIS   Take 1 tablet 1-4 hours prior to sexual activity.           Follow-up Information    Follow up with Dow Adolph, MD.   Contact information:   [address] [place] [address] [phone]       Follow up with Runell Gess, MD. [facility] cardiology. they will call you tomorrow )    Contact information:   [address] [place] [address] [phone]           The results of significant diagnostics from this hospitalization (including imaging, microbiology, ancillary and laboratory) are listed below for reference.    Significant Diagnostic Studies: Dg Chest 2 View  09/10/2012  *RADIOLOGY REPORT*  Clinical Data: Chest pain, cough, congestion.  CHEST - 2 VIEW  Comparison: 10/18/2010  Findings: Heart and mediastinal contours are within normal limits. No focal opacities or effusions.  No acute bony abnormality.  IMPRESSION: No active cardiopulmonary disease.   Original Report Authenticated By: Charlett Nose, M.D.     Microbiology: No results found for this or any previous visit (from the past 240 hour(s)).   Labs: Basic Metabolic Panel:  Lab 09/11/12 5284 09/10/12 1600 09/10/12 0950  NA 137 -- 139  K 4.1 -- 4.4    CL 105 -- 103  CO2 26 -- 27  GLUCOSE 96 -- 104*  BUN 10 -- 10  CREATININE 0.86 0.81 0.78  CALCIUM 8.8 -- 9.7  MG -- -- --  PHOS -- -- --   Liver Function Tests:  Lab 09/10/12 1600  AST 28  ALT 24  ALKPHOS 82  BILITOT 0.2*  PROT 7.4  ALBUMIN 3.9   No results found for this basename: LIPASE:5,AMYLASE:5 in the last 168 hours No results found for  this basename: AMMONIA:5 in the last 168 hours CBC:  Lab 09/11/12 0545 09/10/12 1600 09/10/12 0950  WBC 7.0 7.0 7.9  NEUTROABS -- -- 3.8  HGB 14.1 15.2 16.1  HCT 42.3 45.0 45.7  MCV 90.0 89.3 87.7  PLT 272 345 348   Cardiac Enzymes:  Lab 09/11/12 0545 09/11/12 0100 09/10/12 1802 09/10/12 1453  CKTOTAL -- -- -- --  CKMB -- -- -- --  CKMBINDEX -- -- -- --  TROPONINI <0.30 <0.30 <0.30 <0.30   BNP: BNP (last 3 results) No results found for this basename: PROBNP:3 in the last 8760 hours CBG: No results found for this basename: GLUCAP:5 in the last 168 hours     Signed:  Lynesha Nolan  [facility] 09/11/2012, 10:23 AM

## 2012-09-12 ENCOUNTER — Other Ambulatory Visit (HOSPITAL_COMMUNITY): Payer: Self-pay | Admitting: Internal Medicine

## 2012-09-12 DIAGNOSIS — R079 Chest pain, unspecified: Secondary | ICD-10-CM

## 2012-09-13 ENCOUNTER — Ambulatory Visit (HOSPITAL_COMMUNITY)
Admission: RE | Admit: 2012-09-13 | Discharge: 2012-09-13 | Disposition: A | Discharge status: Home / Self Care | Payer: BC Managed Care – PPO | Source: Ambulatory Visit | Attending: Cardiovascular Disease | Admitting: Cardiovascular Disease

## 2012-09-13 DIAGNOSIS — R079 Chest pain, unspecified: Secondary | ICD-10-CM | POA: Insufficient documentation

## 2012-09-16 ENCOUNTER — Encounter: Payer: Self-pay | Admitting: Cardiology

## 2012-12-07 ENCOUNTER — Other Ambulatory Visit: Payer: Self-pay | Admitting: Physician Assistant

## 2013-03-13 ENCOUNTER — Other Ambulatory Visit: Payer: Self-pay | Admitting: Family Medicine

## 2013-03-25 ENCOUNTER — Other Ambulatory Visit: Payer: Self-pay | Admitting: Family Medicine

## 2013-03-26 ENCOUNTER — Other Ambulatory Visit: Payer: Self-pay | Admitting: Family Medicine

## 2013-03-27 NOTE — Telephone Encounter (Signed)
Pt last seen by me over 1 year ago. Needs to sch appt.

## 2013-05-07 ENCOUNTER — Other Ambulatory Visit: Payer: Self-pay | Admitting: Family Medicine

## 2013-05-24 ENCOUNTER — Encounter: Payer: Self-pay | Admitting: Family Medicine

## 2013-05-28 ENCOUNTER — Other Ambulatory Visit: Payer: Self-pay | Admitting: Family Medicine

## 2013-05-29 NOTE — Telephone Encounter (Signed)
Pt requesting Cialis refill but last seen by me June 2013. He was hospitalized in Dec 2013 with chest pain. He will have to schedule an office visit. Pt was supposed to f/u here after hospitalization in Dec 2013.

## 2013-06-04 ENCOUNTER — Emergency Department (HOSPITAL_COMMUNITY)
Admission: EM | Admit: 2013-06-04 | Discharge: 2013-06-04 | Disposition: A | Discharge status: Home / Self Care | Payer: BC Managed Care – PPO | Attending: Emergency Medicine | Admitting: Emergency Medicine

## 2013-06-04 ENCOUNTER — Emergency Department (HOSPITAL_COMMUNITY): Payer: BC Managed Care – PPO

## 2013-06-04 ENCOUNTER — Encounter (HOSPITAL_COMMUNITY): Payer: Self-pay | Admitting: Nurse Practitioner

## 2013-06-04 DIAGNOSIS — F909 Attention-deficit hyperactivity disorder, unspecified type: Secondary | ICD-10-CM | POA: Insufficient documentation

## 2013-06-04 DIAGNOSIS — Z862 Personal history of diseases of the blood and blood-forming organs and certain disorders involving the immune mechanism: Secondary | ICD-10-CM | POA: Insufficient documentation

## 2013-06-04 DIAGNOSIS — R109 Unspecified abdominal pain: Secondary | ICD-10-CM | POA: Insufficient documentation

## 2013-06-04 DIAGNOSIS — R079 Chest pain, unspecified: Secondary | ICD-10-CM

## 2013-06-04 DIAGNOSIS — Z8719 Personal history of other diseases of the digestive system: Secondary | ICD-10-CM | POA: Insufficient documentation

## 2013-06-04 DIAGNOSIS — Z8739 Personal history of other diseases of the musculoskeletal system and connective tissue: Secondary | ICD-10-CM | POA: Insufficient documentation

## 2013-06-04 DIAGNOSIS — R059 Cough, unspecified: Secondary | ICD-10-CM

## 2013-06-04 DIAGNOSIS — K219 Gastro-esophageal reflux disease without esophagitis: Secondary | ICD-10-CM | POA: Insufficient documentation

## 2013-06-04 DIAGNOSIS — F329 Major depressive disorder, single episode, unspecified: Secondary | ICD-10-CM | POA: Insufficient documentation

## 2013-06-04 DIAGNOSIS — Z8669 Personal history of other diseases of the nervous system and sense organs: Secondary | ICD-10-CM | POA: Insufficient documentation

## 2013-06-04 DIAGNOSIS — F3289 Other specified depressive episodes: Secondary | ICD-10-CM | POA: Insufficient documentation

## 2013-06-04 DIAGNOSIS — R05 Cough: Secondary | ICD-10-CM

## 2013-06-04 DIAGNOSIS — Z79899 Other long term (current) drug therapy: Secondary | ICD-10-CM | POA: Insufficient documentation

## 2013-06-04 DIAGNOSIS — R0602 Shortness of breath: Secondary | ICD-10-CM | POA: Insufficient documentation

## 2013-06-04 LAB — CBC
HCT: 45.1 % (ref 39.0–52.0)
Hemoglobin: 15.6 g/dL (ref 13.0–17.0)
MCHC: 34.6 g/dL (ref 30.0–36.0)
RBC: 5.02 MIL/uL (ref 4.22–5.81)
RDW: 12.1 % (ref 11.5–15.5)
WBC: 7.6 10*3/uL (ref 4.0–10.5)

## 2013-06-04 LAB — BASIC METABOLIC PANEL
BUN: 11 mg/dL (ref 6–23)
CO2: 27 mEq/L (ref 19–32)
Chloride: 100 mEq/L (ref 96–112)
GFR calc Af Amer: >90 mL/min (ref >90)
Glucose, Bld: 104 mg/dL — ABNORMAL HIGH (ref 70–99)
Potassium: 4.3 mEq/L (ref 3.5–5.1)
Sodium: 135 mEq/L (ref 135–145)

## 2013-06-04 LAB — POCT I-STAT TROPONIN I: Troponin i, poc: 0.01 ng/mL (ref 0.00–0.08)

## 2013-06-04 LAB — HEPATIC FUNCTION PANEL
ALT: 24 U/L (ref 0–53)
AST: 20 U/L (ref 0–37)
Bilirubin, Direct: <0.1 mg/dL (ref 0.0–0.3)

## 2013-06-04 LAB — D-DIMER, QUANTITATIVE (NOT AT ARMC): D-Dimer, Quant: <0.27 ug/mL-FEU (ref 0.00–0.48)

## 2013-06-04 MED ORDER — GI COCKTAIL ~~LOC~~
30.0000 mL | Freq: Once | ORAL | Status: AC
  Administered 2013-06-04: 30 mL via ORAL
  Filled 2013-06-04: qty 30

## 2013-06-04 MED ORDER — SUCRALFATE 1 GM/10ML PO SUSP: 1.0000 g | Freq: Four times a day (QID) | ORAL | Status: AC | PRN

## 2013-06-04 NOTE — ED Provider Notes (Signed)
CSN: [id number]     Arrival date & time 06/04/13  4132 History   First MD Initiated Contact with Patient 06/04/13 [phone]     Chief Complaint  Patient presents with  . Chest Pain   (Consider location/radiation/quality/duration/timing/severity/associated sxs/prior Treatment) Patient is a 52 y.o. male presenting with chest pain.  Chest Pain Associated symptoms: abdominal pain and shortness of breath   Associated symptoms: no back pain, no cough, no dizziness, no fever, no headache, no nausea, no numbness, no palpitations, not vomiting and no weakness    Patient with a history of GERD on Prilosec presents with epigastric/substernal chest pain that started this morning around 7 AM. The pain did not radiate. Patient describes the pain as dull in nature. The pain was at its worst patient admits to mild tachypnea and sensation that he could not catch his breath He has had some hoarseness for the past couple of days has been under increased stress. Patient has been seen by cardiology earlier this year and had complete cardiology workup including stress test which was normal. Patient states the pain is much better now though some vague discomfort still exist. He took his Prilosec times and 3, nitroglycerin glycerin prior to arrival with little improvement. Patient reports having orange juice and posterior strudel for breakfast roughly one hour before the symptoms began. He has no lower sugary swelling or pain. He does family history of myocardial infarction and stroke on his father's side. Past Medical History  Diagnosis Date  . Allergy   . Anemia   . Arthritis   . Depression   . GERD (gastroesophageal reflux disease)   . Restless leg   . ADHD (attention deficit hyperactivity disorder)   . Barrett's esophagus    Past Surgical History  Procedure Laterality Date  . Gynecomastia excision    . Intussusception repair      infant   Family History  Problem Relation Age of Onset  . Cancer Mother 68   pancreatic  . Arthritis Mother   . Heart disease Father     MI  AND ASCVD  . Diabetes Father     TYPE 2  . Cancer Maternal Grandmother   . Arthritis Maternal Grandmother   . Cancer Maternal Grandfather   . Depression Daughter   . ADD / ADHD Daughter   . Heart disease Paternal Aunt     ASCVD  . Heart disease Paternal Grandmother     CVA ?  Marland Kitchen Hypertension Paternal Grandmother   . Heart disease Paternal Grandfather     CVA, ASCVD  . Hypertension Paternal Grandfather    History  Substance Use Topics  . Smoking status: Never Smoker   . Smokeless tobacco: Not on file  . Alcohol Use: Yes     Comment: drink wine and beer    Review of Systems  Constitutional: Negative for fever and chills.  HENT: Negative for neck pain.   Respiratory: Positive for shortness of breath. Negative for cough and chest tightness.   Cardiovascular: Positive for chest pain. Negative for palpitations and leg swelling.  Gastrointestinal: Positive for abdominal pain. Negative for nausea and vomiting.  Musculoskeletal: Negative for myalgias and back pain.  Skin: Negative for rash and wound.  Neurological: Negative for dizziness, weakness, light-headedness, numbness and headaches.  All other systems reviewed and are negative.    Allergies  Sulfa antibiotics and Bee venom  Home Medications   Current Outpatient Rx  Name  Route  Sig  Dispense  Refill  .  amphetamine-dextroamphetamine (ADDERALL XR) 30 MG 24 hr capsule   Oral   Take 30 mg by mouth every morning.         Marland Kitchen buPROPion (WELLBUTRIN XL) 300 MG 24 hr tablet   Oral   Take 300 mg by mouth daily.         Marland Kitchen gabapentin (NEURONTIN) 100 MG capsule   Oral   Take 300-600 mg by mouth 3 (three) times daily. Pt takes 300 mg every morning, 300 mg in the evening, 600 mg at bedtime         . loperamide (IMODIUM A-D) 2 MG tablet   Oral   Take 4 mg by mouth daily.         . nitroGLYCERIN (NITROSTAT) 0.4 MG SL tablet   Sublingual   Place 1  tablet (0.4 mg total) under the tongue every 5 (five) minutes as needed for chest pain.   10 tablet   0   . omeprazole (PRILOSEC) 40 MG capsule   Oral   Take 40 mg by mouth daily.         . tadalafil (CIALIS) 10 MG tablet      Take 1 tablet 1-4 hours prior to sexual activity.   10 tablet   3    BP 118/81  Pulse 94  Temp(Src) 98.1 F (36.7 C) (Oral)  Resp 18  SpO2 99% Physical Exam  Nursing note and vitals reviewed. Constitutional: He is oriented to person, place, and time. He appears well-developed and well-nourished. No distress.  HENT:  Head: Normocephalic and atraumatic.  Mouth/Throat: Oropharynx is clear and moist.  Eyes: EOM are normal. Pupils are equal, round, and reactive to light.  Neck: Normal range of motion. Neck supple.  Cardiovascular: Normal rate and regular rhythm.   Pulmonary/Chest: Effort normal and breath sounds normal. No respiratory distress. He has no wheezes. He has no rales. He exhibits no tenderness.  Abdominal: Soft. Bowel sounds are normal. He exhibits no distension and no mass. There is no tenderness. There is no rebound and no guarding.  Musculoskeletal: Normal range of motion. He exhibits no edema and no tenderness.  No calf swelling or tenderness.  Neurological: He is alert and oriented to person, place, and time.  Patient is alert and oriented x3 with clear, goal oriented speech. Patient has 5/5 motor in all extremities. Sensation is intact to light touch.   Skin: Skin is warm and dry. No rash noted. No erythema.  Psychiatric:  Patient appears mildly anxious.    ED Course  Procedures (including critical care time) Labs Review Labs Reviewed  CBC  BASIC METABOLIC PANEL  HEPATIC FUNCTION PANEL  LIPASE, BLOOD  D-DIMER, QUANTITATIVE   Imaging Review No results found.  Date: 06/04/2013  Rate: 98  Rhythm: normal sinus rhythm  QRS Axis: normal  Intervals: normal  ST/T Wave abnormalities: normal  Conduction Disutrbances:none   Narrative Interpretation:   Old EKG Reviewed: unchanged   MDM  Patient has few risk factors for coronary artery disease and has had a negative stress tests within the last 9 months. His symptoms sound atypical for coronary artery disease. I think PE is also very unlikely but given the fact he is having shortness of breath and tachycardia with his symptoms we'll screen with a d-dimer. More likely this is a recurrence of patient's ongoing gastrointestinal reflux disease. We'll treat with GI cocktail and reevaluated.  Patient evaluated by cardiology who also believes of the patient's pain is related to his reflux disease.  He is advised to followup with his gastroenterologist. Do not think further workup in the emergency department as necessary.  Patient volunteered that he has not been having 2 weeks of on and off headaches but he did not have a headache currently. He questions whether this is due to his poor sleep pattern. He has no focal weakness, neck stiffness or numbness. He denies vision changes. Patient has a history of migraines in the past associated with stress. Has been advised to followup with a neurologist for his ongoing symptoms. I do not believe further workup is necessary at this time. Patient been given thorough discharge instructions and return precautions.  Loren Racer, MD 06/04/13 [phone]

## 2013-06-04 NOTE — ED Notes (Signed)
States last Thursday had "the worst headache I ever had-- came on suddenly, took awhile to go away-- not in one place, no visual changes, no numbness or tingling-- went away slowly"

## 2013-06-04 NOTE — ED Notes (Signed)
Stated took three NTG prior to arriving.

## 2013-06-04 NOTE — ED Notes (Signed)
Pt stated that he feels " a little light-headed" pain scale of head ache is 3/10

## 2013-06-04 NOTE — ED Notes (Signed)
Pt resting in bed on his cell phone

## 2013-06-04 NOTE — ED Notes (Addendum)
Pt reports he noticed sharp epigastric/substernal chest pain after waking this am. Reports history of GERD that is normally controlled by prilosec but feels he has had more acid this week because he has been hoarse. Reports pain was intermittent 4/10 and his PR went up to 120s at onset. Pt took 3 nitro, tums, 2 prilosec PTA

## 2013-06-04 NOTE — Consult Note (Signed)
Reason for Consult:CP Referring Physician: ER  Bradley Nolan is an 52 y.o. male.  HPI: The patient is a 52 yo caucasian male with who works as a Radiation protection practitioner and has a history of GERD, ADHD, Barrett's esophagus.  He had a Exercise stress test September 16, 2012 which was negative.  He presents with a 3/10, vague epigastric discomfort which started this morning one hour after eating breakfast.  No associated symptoms other than tachypnea (30).  The pain is currently resolved.  He does report drinking a lot of tea the night before.  His sleep habits are poor and he does not exercise.    EKG is unremarkable.  Past Medical History  Diagnosis Date  . Allergy   . Anemia   . Arthritis   . Depression   . GERD (gastroesophageal reflux disease)   . Restless leg   . ADHD (attention deficit hyperactivity disorder)   . Barrett's esophagus     Past Surgical History  Procedure Laterality Date  . Gynecomastia excision    . Intussusception repair      infant    Family History  Problem Relation Age of Onset  . Cancer Mother 83    pancreatic  . Arthritis Mother   . Heart disease Father     MI  AND ASCVD  . Diabetes Father     TYPE 2  . Cancer Maternal Grandmother   . Arthritis Maternal Grandmother   . Cancer Maternal Grandfather   . Depression Daughter   . ADD / ADHD Daughter   . Heart disease Paternal Aunt     ASCVD  . Heart disease Paternal Grandmother     CVA ?  Marland Kitchen Hypertension Paternal Grandmother   . Heart disease Paternal Grandfather     CVA, ASCVD  . Hypertension Paternal Grandfather     Social History:  reports that he has never smoked. He does not have any smokeless tobacco history on file. He reports that  drinks alcohol. He reports that he does not use illicit drugs.  Allergies:  Allergies  Allergen Reactions  . Sulfa Antibiotics Anaphylaxis  . Bee Venom Hives    Medications:  Prior to Admission medications   Medication Sig Start Date End Date Taking? Authorizing  Provider  amphetamine-dextroamphetamine (ADDERALL XR) 30 MG 24 hr capsule Take 30 mg by mouth 2 (two) times daily.    Yes Historical Provider, MD  Armodafinil (NUVIGIL) 250 MG tablet Take 125-250 mg by mouth daily.   Yes Historical Provider, MD  buPROPion (WELLBUTRIN XL) 300 MG 24 hr tablet Take 300 mg by mouth daily.   Yes Historical Provider, MD  clonazePAM (KLONOPIN) 1 MG tablet Take 1 mg by mouth 3 (three) times daily as needed for anxiety.   Yes Historical Provider, MD  gabapentin (NEURONTIN) 100 MG capsule Take 100 mg by mouth at bedtime.    Yes Historical Provider, MD  loperamide (IMODIUM A-D) 2 MG tablet Take 4 mg by mouth daily.   Yes Historical Provider, MD  loratadine (CLARITIN) 10 MG tablet Take 10 mg by mouth 2 (two) times daily.   Yes Historical Provider, MD  nitroGLYCERIN (NITROSTAT) 0.4 MG SL tablet Place 1 tablet (0.4 mg total) under the tongue every 5 (five) minutes as needed for chest pain. 09/11/12  Yes Sorin Luanne Bras, MD  omeprazole (PRILOSEC) 40 MG capsule Take 40 mg by mouth daily.   Yes Historical Provider, MD  tadalafil (CIALIS) 10 MG tablet Take 1 tablet 1-4 hours prior to  sexual activity. 02/21/12  Yes Maurice March, MD     Results for orders placed during the hospital encounter of 06/04/13 (from the past 48 hour(s))  CBC     Status: None   Collection Time    06/04/13 10:10 AM      Result Value Range   WBC 7.6  4.0 - 10.5 K/uL   RBC 5.02  4.22 - 5.81 MIL/uL   Hemoglobin 15.6  13.0 - 17.0 g/dL   HCT 16.1  09.6 - 04.5 %   MCV 89.8  78.0 - 100.0 fL   MCH 31.1  26.0 - 34.0 pg   MCHC 34.6  30.0 - 36.0 g/dL   RDW 40.9  81.1 - 91.4 %   Platelets 304  150 - 400 K/uL  BASIC METABOLIC PANEL     Status: Abnormal   Collection Time    06/04/13 10:10 AM      Result Value Range   Sodium 135  135 - 145 mEq/L   Potassium 4.3  3.5 - 5.1 mEq/L   Chloride 100  96 - 112 mEq/L   CO2 27  19 - 32 mEq/L   Glucose, Bld 104 (*) 70 - 99 mg/dL   BUN 11  6 - 23 mg/dL   Creatinine,  Ser 7.82  0.50 - 1.35 mg/dL   Calcium 9.3  8.4 - 95.6 mg/dL   GFR calc non Af Amer >90  >90 mL/min   GFR calc Af Amer >90  >90 mL/min   Comment: (NOTE)     The eGFR has been calculated using the CKD EPI equation.     This calculation has not been validated in all clinical situations.     eGFR's persistently <90 mL/min signify possible Chronic Kidney     Disease.  HEPATIC FUNCTION PANEL     Status: None   Collection Time    06/04/13 10:10 AM      Result Value Range   Total Protein 6.9  6.0 - 8.3 g/dL   Albumin 3.9  3.5 - 5.2 g/dL   AST 20  0 - 37 U/L   ALT 24  0 - 53 U/L   Alkaline Phosphatase 67  39 - 117 U/L   Total Bilirubin 0.3  0.3 - 1.2 mg/dL   Bilirubin, Direct <2.1  0.0 - 0.3 mg/dL   Indirect Bilirubin NOT CALCULATED  0.3 - 0.9 mg/dL  LIPASE, BLOOD     Status: None   Collection Time    06/04/13 10:10 AM      Result Value Range   Lipase 16  11 - 59 U/L  D-DIMER, QUANTITATIVE     Status: None   Collection Time    06/04/13 10:10 AM      Result Value Range   D-Dimer, Quant <0.27  0.00 - 0.48 ug/mL-FEU   Comment:            AT THE INHOUSE ESTABLISHED CUTOFF     VALUE OF 0.48 ug/mL FEU,     THIS ASSAY HAS BEEN DOCUMENTED     IN THE LITERATURE TO HAVE     A SENSITIVITY AND NEGATIVE     PREDICTIVE VALUE OF AT LEAST     98 TO 99%.  THE TEST RESULT     SHOULD BE CORRELATED WITH     AN ASSESSMENT OF THE CLINICAL     PROBABILITY OF DVT / VTE.  POCT I-STAT TROPONIN I     Status: None  Collection Time    06/04/13 10:13 AM      Result Value Range   Troponin i, poc 0.01  0.00 - 0.08 ng/mL   Comment 3            Comment: Due to the release kinetics of cTnI,     a negative result within the first hours     of the onset of symptoms does not rule out     myocardial infarction with certainty.     If myocardial infarction is still suspected,     repeat the test at appropriate intervals.    Dg Chest 2 View  06/04/2013   CLINICAL DATA:  Chest pain, shortness of breath  EXAM:  CHEST  2 VIEW  COMPARISON:  Chest x-ray of 09/10/2012  FINDINGS: No active infiltrate or effusion is seen. Mediastinal contours are stable. The heart is within normal limits in size. No bony abnormality is seen.  IMPRESSION: Stable chest x-ray. No active lung disease.   Electronically Signed   By: Dwyane Dee M.D.   On: 06/04/2013 10:26    Review of Systems  Constitutional: Negative for fever and diaphoresis.  HENT: Negative for congestion and sore throat.        Hoarseness.  Respiratory: Negative for cough.   Cardiovascular: Positive for chest pain (Epigastric). Negative for orthopnea, leg swelling and PND.  Gastrointestinal: Negative for nausea, vomiting, abdominal pain, blood in stool and melena.  Genitourinary: Negative for hematuria.  Neurological: Negative for dizziness.  All other systems reviewed and are negative.   Blood pressure 117/64, pulse 81, temperature 98.1 F (36.7 C), temperature source Oral, resp. rate 16, SpO2 100.00%. Physical Exam  Nursing note and vitals reviewed. Constitutional: He is oriented to person, place, and time. He appears well-developed and well-nourished. No distress.  HENT:  Head: Normocephalic and atraumatic.  Eyes: EOM are normal. Pupils are equal, round, and reactive to light. No scleral icterus.  Neck: Normal range of motion. Neck supple.  Cardiovascular: Normal rate, regular rhythm, S1 normal and S2 normal.   No murmur heard. Pulses:      Radial pulses are 2+ on the right side, and 2+ on the left side.  No Carotid bruits.  Respiratory: Effort normal and breath sounds normal. He has no wheezes. He has no rales.  GI: Soft. Bowel sounds are normal. He exhibits no distension. There is no tenderness.  Musculoskeletal: He exhibits no edema.  Lymphadenopathy:    He has no cervical adenopathy.  Neurological: He is alert and oriented to person, place, and time. He exhibits normal muscle tone.  Skin: Skin is warm and dry.  Psychiatric: He has a  normal mood and affect.    Assessment/Plan: Active Problems:   GERD (gastroesophageal reflux disease)   Chest pain  Plan:  Given the patient's history of GERD and Barrett's esophagus along with recent negative stress test, I think his epigastric/chest pain is likely GI related.  Recommend follow up with his GI MD. He has been noticing worsening hoarseness as well.  Labs are stable.  Troponin negative.  HAGER, BRYAN 06/04/2013, 1:04 PM    Patient seen and examined. Agree with assessment and plan. Long discussion with patient. 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 Symptom complex is non-cardiac. ECG normal. Troponin negative. Will dc home; Long discussion concerning increased mortality associated with chronic sleep deprivation <5 hrs/night. GI follow-up.   Tandrea Kommer A.  Tresa Endo, MD, [facility] 06/04/2013 1:29 PM

## 2013-06-04 NOTE — ED Notes (Signed)
Regular doctor-- [facility]--

## 2013-07-17 ENCOUNTER — Other Ambulatory Visit: Payer: Self-pay

## 2014-08-26 IMAGING — CR DG CHEST 2V
2 series · 2 of 2 positions shown · non-contrast
Comparison: Chest x-ray of 09/10/2012

CLINICAL DATA: Chest pain, shortness of breath

EXAM:
CHEST  2 VIEW

[w chest pa]
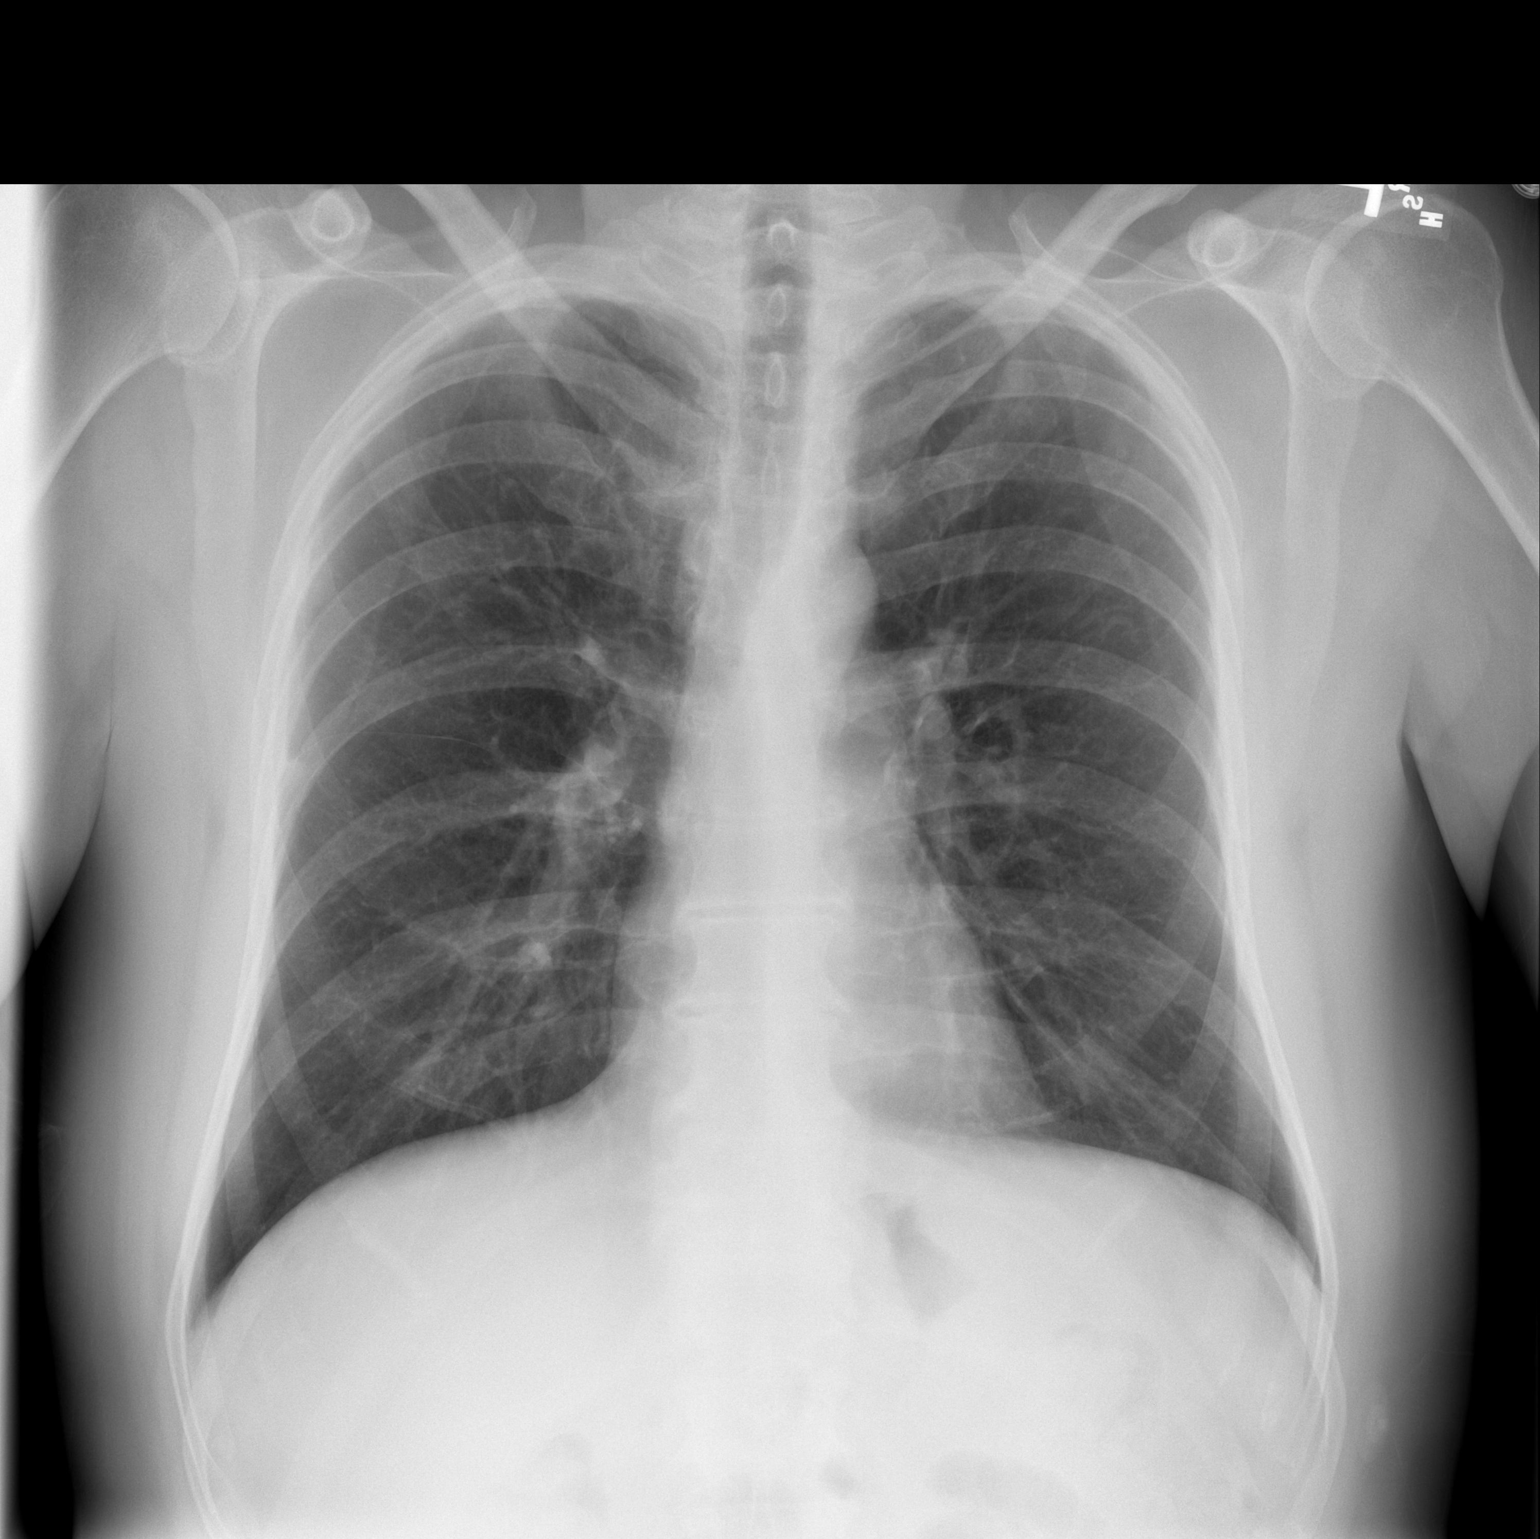

[w chest lat]
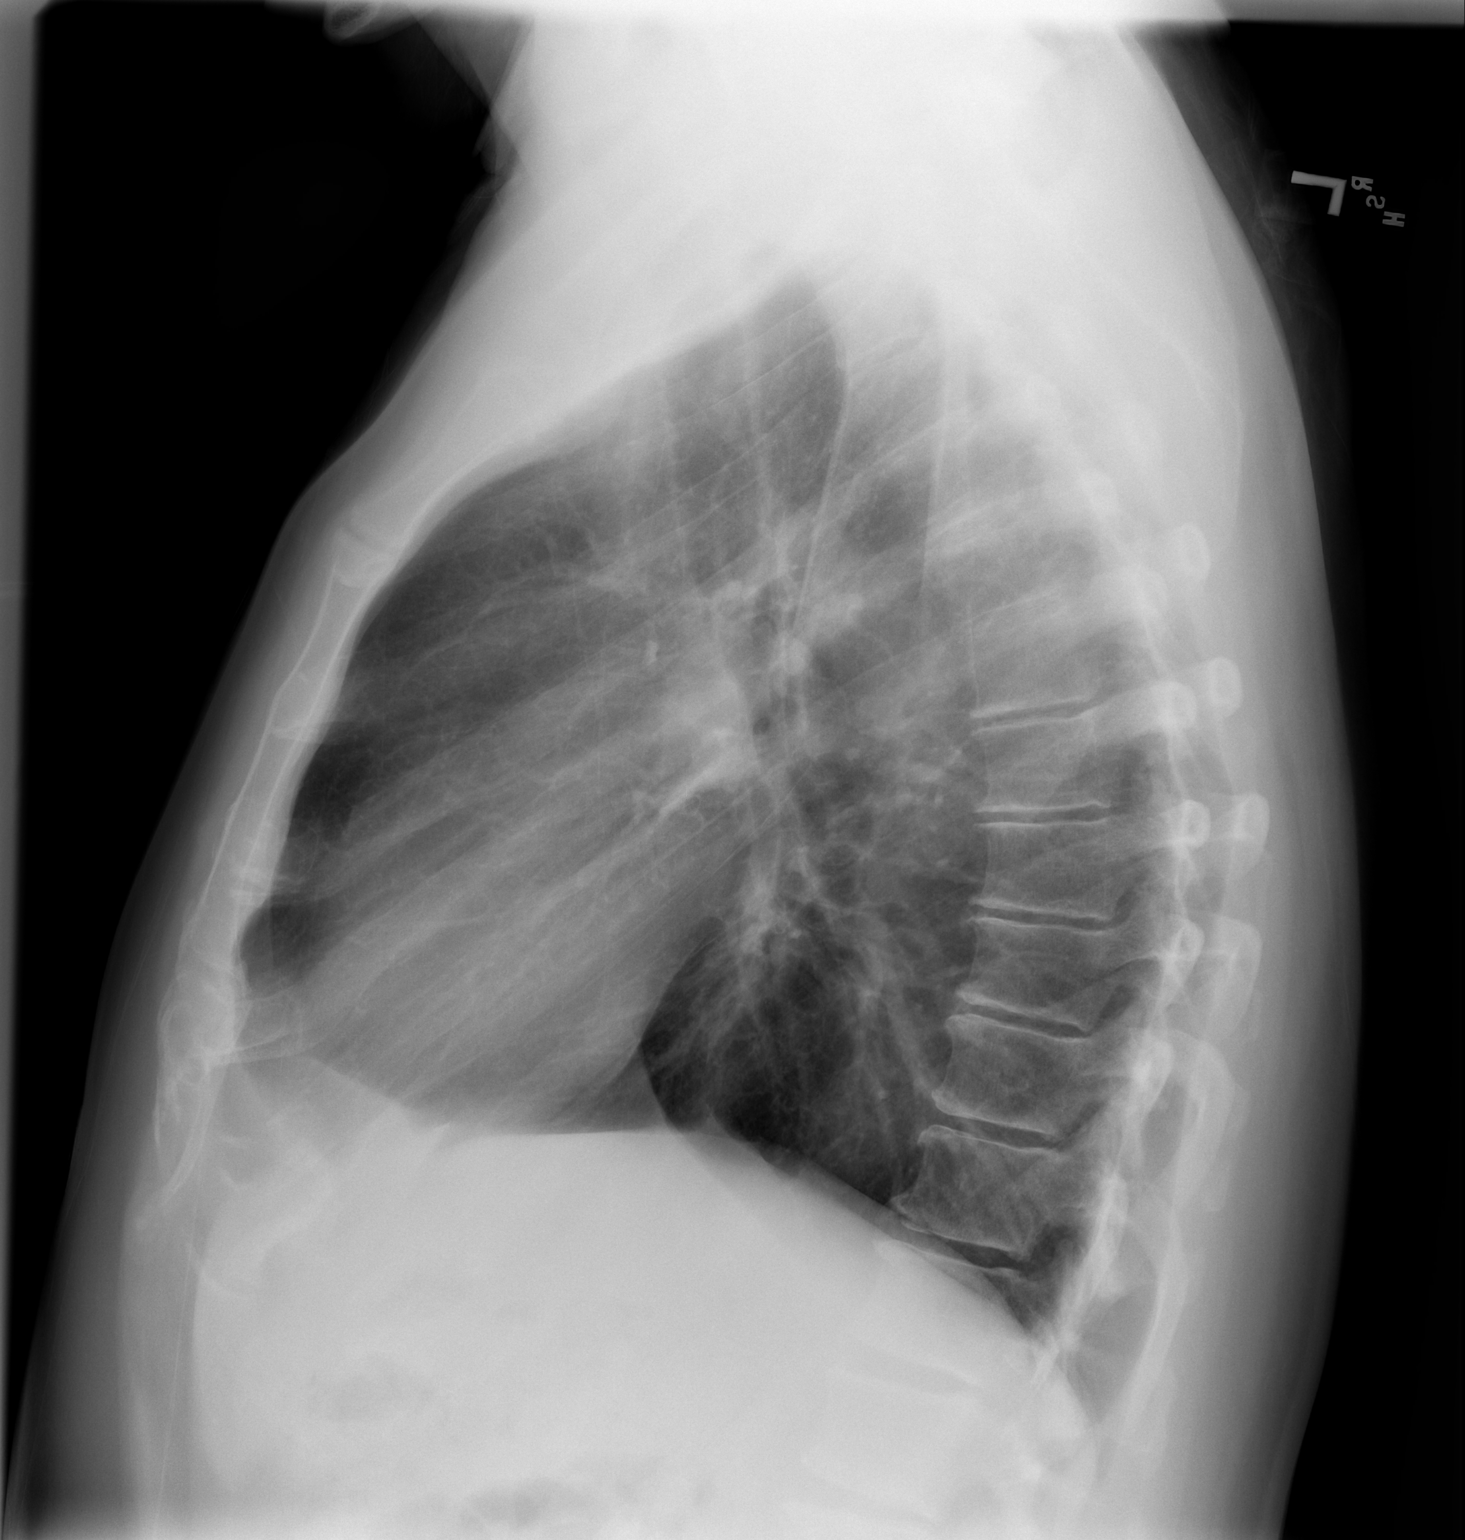

[2 of 2 positions shown; findings below may reference images not displayed]

FINDINGS: No active infiltrate or effusion is seen. Mediastinal contours are
stable. The heart is within normal limits in size. No bony
abnormality is seen.
IMPRESSION: Stable chest x-ray. No active lung disease.

## 2014-09-18 ENCOUNTER — Encounter: Payer: Self-pay | Admitting: Family Medicine

## 2017-06-22 ENCOUNTER — Telehealth: Payer: Self-pay | Admitting: Internal Medicine

## 2017-06-22 NOTE — Telephone Encounter (Signed)
Patient sent a request to be a new patient through the online protal. Would be willing to accept him as a new patient?

## 2017-06-22 NOTE — Telephone Encounter (Signed)
Emailing pt to schedule appointment.

## 2017-06-22 NOTE — Telephone Encounter (Signed)
Ok with me, thanks.

## 2017-07-17 ENCOUNTER — Ambulatory Visit: Payer: Self-pay | Admitting: Internal Medicine

## 2018-01-25 ENCOUNTER — Emergency Department (HOSPITAL_COMMUNITY)
Admission: EM | Admit: 2018-01-25 | Discharge: 2018-01-25 | Disposition: A | Discharge status: Home / Self Care | Payer: BLUE CROSS/BLUE SHIELD | Attending: Emergency Medicine | Admitting: Emergency Medicine

## 2018-01-25 ENCOUNTER — Emergency Department (HOSPITAL_COMMUNITY): Payer: BLUE CROSS/BLUE SHIELD

## 2018-01-25 ENCOUNTER — Encounter (HOSPITAL_COMMUNITY): Payer: Self-pay | Admitting: Emergency Medicine

## 2018-01-25 DIAGNOSIS — R0789 Other chest pain: Secondary | ICD-10-CM | POA: Insufficient documentation

## 2018-01-25 DIAGNOSIS — R05 Cough: Secondary | ICD-10-CM | POA: Diagnosis present

## 2018-01-25 DIAGNOSIS — F909 Attention-deficit hyperactivity disorder, unspecified type: Secondary | ICD-10-CM | POA: Diagnosis not present

## 2018-01-25 DIAGNOSIS — E039 Hypothyroidism, unspecified: Secondary | ICD-10-CM | POA: Insufficient documentation

## 2018-01-25 DIAGNOSIS — Z79899 Other long term (current) drug therapy: Secondary | ICD-10-CM | POA: Insufficient documentation

## 2018-01-25 DIAGNOSIS — R059 Cough, unspecified: Secondary | ICD-10-CM

## 2018-01-25 MED ORDER — GI COCKTAIL ~~LOC~~
30.0000 mL | Freq: Once | ORAL | Status: AC
  Administered 2018-01-25: 30 mL via ORAL
  Filled 2018-01-25: qty 30

## 2018-01-25 NOTE — Discharge Instructions (Addendum)
As discussed, your evaluation today has been largely reassuring.  But, it is important that you monitor your condition carefully, and do not hesitate to return to the ED if you develop new, or concerning changes in your condition. ? ?Otherwise, please follow-up with your physician for appropriate ongoing care. ? ?

## 2018-01-25 NOTE — ED Provider Notes (Signed)
[facility] EMERGENCY DEPARTMENT Provider Note   CSN: [id number] Arrival date & time: 01/25/18  4098     History   Chief Complaint Chief Complaint  Patient presents with  . Cough    HPI Bradley Nolan is a 57 y.o. male.  HPI Patient presents with concern of cough, possible aspiration. Patient has a history of Barrett's esophagus, states that he has had coughing spells, and choking sensation several times recently, including 2 days ago, and then again today, just before ED arrival. Patient notes that this frequency of episodes is unusual, and concerning to him. No fever, no vomiting, no chest pain, and he feels better prior to our evaluation. No clear precipitant.  Past Medical History:  Diagnosis Date  . ADHD (attention deficit hyperactivity disorder)   . Allergy   . Anemia   . Arthritis   . Barrett's esophagus   . Depression   . GERD (gastroesophageal reflux disease)   . Restless leg     Patient Active Problem List   Diagnosis Date Noted  . Chest pain 09/10/2012  . Cough 09/10/2012  . Restless leg syndrome 09/10/2012  . Allergic rhinitis, cause unspecified 02/26/2012  . Hypothyroidism 02/26/2012  . Depression 02/26/2012  . GERD (gastroesophageal reflux disease) 02/26/2012  . Overweight (BMI 25.0-29.9) 02/26/2012    Past Surgical History:  Procedure Laterality Date  . GYNECOMASTIA EXCISION    . INTUSSUSCEPTION REPAIR     infant        Home Medications    Prior to Admission medications   Medication Sig Start Date End Date Taking? Authorizing Provider  amphetamine-dextroamphetamine (ADDERALL) 30 MG tablet Take 30 mg by mouth 2 (two) times daily.   Yes [provider]  buPROPion (WELLBUTRIN XL) 150 MG 24 hr tablet Take 150 mg by mouth daily.   Yes [provider]  clonazePAM (KLONOPIN) 1 MG tablet Take 1 mg by mouth 3 (three) times daily as needed for anxiety.   Yes [provider]  gabapentin (NEURONTIN)  300 MG capsule Take 300 mg by mouth 2 (two) times daily.   Yes [provider]  ibuprofen (ADVIL,MOTRIN) 200 MG tablet Take 400-600 mg by mouth every 6 (six) hours as needed for mild pain.   Yes [provider]  loperamide (IMODIUM) 2 MG capsule Take 6 mg by mouth 2 (two) times daily.   Yes [provider]  nitroGLYCERIN (NITROSTAT) 0.4 MG SL tablet Place 1 tablet (0.4 mg total) under the tongue every 5 (five) minutes as needed for chest pain. 09/11/12  Yes Laza, Sorin C, MD  omeprazole (PRILOSEC) 20 MG capsule Take 20 mg by mouth daily.   Yes [provider]  sucralfate (CARAFATE) 1 GM/10ML suspension Take 10 mLs (1 g total) by mouth 4 (four) times daily as needed (epigastric/chest pain). Patient not taking: Reported on 01/25/2018 06/04/13   Loren Racer, MD  tadalafil (CIALIS) 10 MG tablet Take 1 tablet 1-4 hours prior to sexual activity. Patient not taking: Reported on 01/25/2018 02/21/12   Maurice March, MD    Family History Family History  Problem Relation Age of Onset  . Cancer Mother 29       pancreatic  . Arthritis Mother   . Heart disease Father        MI  AND ASCVD  . Diabetes Father        TYPE 2  . Cancer Maternal Grandmother   . Arthritis Maternal Grandmother   . Cancer Maternal  Grandfather   . Depression Daughter   . ADD / ADHD Daughter   . Heart disease Paternal Aunt        ASCVD  . Heart disease Paternal Grandmother        CVA ?  Marland Kitchen Hypertension Paternal Grandmother   . Heart disease Paternal Grandfather        CVA, ASCVD  . Hypertension Paternal Grandfather     Social History Social History   Tobacco Use  . Smoking status: Never Smoker  . Smokeless tobacco: Never Used  Substance Use Topics  . Alcohol use: Yes    Comment: drink wine and beer  . Drug use: No     Allergies   Sulfa antibiotics and Bee venom   Review of Systems Review of Systems  Constitutional:       Per HPI, otherwise negative  HENT:        Per HPI, otherwise negative  Respiratory:       Per HPI, otherwise negative  Cardiovascular:       Per HPI, otherwise negative  Gastrointestinal: Negative for vomiting.  Endocrine:       Negative aside from HPI  Genitourinary:       Neg aside from HPI   Musculoskeletal:       Per HPI, otherwise negative  Skin: Negative.   Neurological: Negative for syncope.     Physical Exam Updated Vital Signs BP 129/86   Pulse 72   Temp 98.3 F (36.8 C) (Oral)   Resp 19   Ht  (1.803 m)   Wt 91.6 kg (202 lb)   SpO2 96%   BMI 28.17 kg/m   Physical Exam  Constitutional: He is oriented to person, place, and time. He appears well-developed. No distress.  HENT:  Head: Normocephalic and atraumatic.  Eyes: Conjunctivae and EOM are normal.  Cardiovascular: Normal rate and regular rhythm.  Pulmonary/Chest: Effort normal. No stridor. No respiratory distress.  Abdominal: He exhibits no distension.  Musculoskeletal: He exhibits no edema.  Neurological: He is alert and oriented to person, place, and time.  Skin: Skin is warm and dry.  Psychiatric: He has a normal mood and affect.  Nursing note and vitals reviewed.    ED Treatments / Results  Labs (all labs ordered are listed, but only abnormal results are displayed) Labs Reviewed - No data to display  EKG EKG Interpretation  Date/Time:  Friday Jan 25 2018 05:25:07 EDT Ventricular Rate:  77 PR Interval:    QRS Duration: 115 QT Interval:  399 QTC Calculation: 452 R Axis:   63 Text Interpretation:  Sinus rhythm Nonspecific intraventricular conduction delay Artifact Abnormal ekg Confirmed by Gerhard Munch [phone]) on 01/25/2018 5:41:26 AM   Radiology Dg Chest 2 View  Result Date: 01/25/2018 CLINICAL DATA:  57 y/o  M; possible aspiration. EXAM: CHEST - 2 VIEW COMPARISON:  06/04/2013 chest radiograph FINDINGS: Stable cardiac silhouette given differences in technique. Aortic atherosclerosis with calcification. Clear lungs. No  pleural effusion or pneumothorax. No acute osseous abnormality is evident. IMPRESSION: No acute pulmonary process identified. Electronically Signed   By: Mitzi Hansen M.D.   On: 01/25/2018 06:16    Procedures Procedures (including critical care time)  Medications Ordered in ED Medications  gi cocktail (Maalox,Lidocaine,Donnatal) (30 mLs Oral Given 01/25/18 0603)     Initial Impression / Assessment and Plan / ED Course  I have reviewed the triage vital signs and the nursing notes.  Pertinent labs & imaging results that were available  during my care of the patient were reviewed by me and considered in my medical decision making (see chart for details).  On repeat exam the patient is awake and alert, in no distress. We discussed EKG, x-ray which are reassuring, no evidence for pneumonia, no evidence for sustained arrhythmia, nor ischemia. Patient has improved here, was discharged in stable condition.  Final Clinical Impressions(s) / ED Diagnoses  Atypical chest pain   Gerhard Munch, MD 01/25/18 [phone]

## 2018-01-25 NOTE — ED Triage Notes (Signed)
Pt per [facility], pt reports Barret's Esophagus. Hx of cough, low-grade fever and possible aspiration. Reported intermitted chest pain during the past few days but no pain upon arrival.

## 2019-02-14 ENCOUNTER — Telehealth: Payer: BLUE CROSS/BLUE SHIELD | Admitting: Family

## 2019-02-14 DIAGNOSIS — R05 Cough: Secondary | ICD-10-CM

## 2019-02-14 DIAGNOSIS — R059 Cough, unspecified: Secondary | ICD-10-CM

## 2019-02-14 DIAGNOSIS — R0602 Shortness of breath: Secondary | ICD-10-CM

## 2019-02-14 NOTE — Progress Notes (Signed)
Based on what you shared with me, I feel your condition warrants further evaluation and I recommend that you be seen for a face to face office visit.  Given your current symptoms and past history you need to be seen face to face. You more than likely will need a chest x-ray to rule out pneumonia.    NOTE: If you entered your credit card information for this eVisit, you will not be charged. You may see a "hold" on your card for the $35 but that hold will drop off and you will not have a charge processed.  If you are having a true medical emergency please call 911.  If you need an urgent face to face visit, [place] has four urgent care centers for your convenience.    PLEASE NOTE: THE [facility] LOCATIONS AND URGENT CARE CLINICS DO NOT HAVE THE TESTING FOR CORONAVIRUS COVID19 AVAILABLE.  IF YOU FEEL YOU NEED THIS TEST YOU MUST GO TO A TRIAGE LOCATION AT ONE OF THE HOSPITAL EMERGENCY DEPARTMENTS   [email or web address] to reserve your spot online an avoid wait times  [facility] [address], [address] [place], [place] [address] Modified hours of operation: Monday-Friday, 12 PM to 6 PM  Saturday & Sunday 10 AM to 4 PM *Across the street from [employer]  [employer] (New Address!) [address] [place], [place] [address] *Just off [employer], across the road from [employer]* Modified hours of operation: Monday-Friday, 12 PM to 6 PM  Closed Saturday & Sunday  [facility]'s modified hours of operation will be in effect from May 1 until May 31   The following sites will take your insurance:  . [facility]  [phone] Get Driving Directions Find a Provider at this Location  [address] [place], [place] [address] . 10 am to 8 pm Monday-Friday . 12 pm to 8 pm Saturday-Sunday   . [facility]  [phone] Get Driving Directions Find a Provider at this Location   [address] [place], [place] [address] . 8 am to 8 pm Monday-Friday . 9 am to 6 pm Saturday . 11 am to 6 pm Sunday   . [facility] at [facility]  [phone] Get Driving Directions  [address] [place], [place] [address] . 8 am to 8 pm Monday-Friday . 8 am to 4 pm Saturday-Sunday   Your e-visit answers were reviewed by a board certified advanced clinical practitioner to complete your personal care plan.  Thank you for using e-Visits.

## 2019-11-27 ENCOUNTER — Ambulatory Visit: Payer: Self-pay | Attending: Internal Medicine

## 2019-11-27 DIAGNOSIS — Z23 Encounter for immunization: Secondary | ICD-10-CM

## 2019-11-27 NOTE — Progress Notes (Signed)
   Covid-19 Vaccination Clinic  Name:  Bradley Nolan    MRN: [id number] DOB: 05/11/1961  11/27/2019  Bradley Nolan was observed post Covid-19 immunization for 30 minutes based on pre-vaccination screening without incident. He was provided with Vaccine Information Sheet and instruction to access the V-Safe system.   Bradley Nolan was instructed to call 911 with any severe reactions post vaccine: Marland Kitchen Difficulty breathing  . Swelling of face and throat  . A fast heartbeat  . A bad rash all over body  . Dizziness and weakness   Immunizations Administered    Name Date Dose VIS Date Route   Pfizer COVID-19 Vaccine 11/27/2019  2:13 PM 0.3 mL 08/22/2019 Intramuscular   Manufacturer: [employer], [employer]   Lot: [id number]   NDC: [address]-2

## 2019-12-22 ENCOUNTER — Ambulatory Visit: Payer: Self-pay | Attending: Internal Medicine

## 2019-12-22 DIAGNOSIS — Z23 Encounter for immunization: Secondary | ICD-10-CM

## 2019-12-22 NOTE — Progress Notes (Signed)
   Covid-19 Vaccination Clinic  Name:  Bradley Nolan    MRN: [id number] DOB: 1960/12/01  12/22/2019  Mr. Bradley Nolan was observed post Covid-19 immunization for 30 minutes based on pre-vaccination screening without incident. He was provided with Vaccine Information Sheet and instruction to access the V-Safe system.   Mr. Bradley Nolan was instructed to call 911 with any severe reactions post vaccine: Marland Kitchen Difficulty breathing  . Swelling of face and throat  . A fast heartbeat  . A bad rash all over body  . Dizziness and weakness   Immunizations Administered    Name Date Dose VIS Date Route   Pfizer COVID-19 Vaccine 12/22/2019  2:45 PM 0.3 mL 08/22/2019 Intramuscular   Manufacturer: [employer], [employer]   Lot: [id number]   NDC: [address]-3
